# Patient Record
Sex: Female | Born: 1935 | Race: Black or African American | Hispanic: No | Marital: Single | Smoking: Never smoker
Health system: Southern US, Community
[De-identification: ages and names within clinical notes are randomized; demographics above are authoritative.]

## PROBLEM LIST (undated history)

## (undated) DIAGNOSIS — G20A1 Parkinson's disease without dyskinesia, without mention of fluctuations: Secondary | ICD-10-CM

## (undated) DIAGNOSIS — I10 Essential (primary) hypertension: Secondary | ICD-10-CM

## (undated) DIAGNOSIS — E119 Type 2 diabetes mellitus without complications: Secondary | ICD-10-CM

## (undated) DIAGNOSIS — J45909 Unspecified asthma, uncomplicated: Secondary | ICD-10-CM

## (undated) DIAGNOSIS — G20 Parkinson's disease: Secondary | ICD-10-CM

## (undated) DIAGNOSIS — E785 Hyperlipidemia, unspecified: Secondary | ICD-10-CM

## (undated) DIAGNOSIS — K219 Gastro-esophageal reflux disease without esophagitis: Secondary | ICD-10-CM

## (undated) HISTORY — PX: BACK SURGERY: SHX140

## (undated) HISTORY — DX: Gastro-esophageal reflux disease without esophagitis: K21.9

---

## 2005-02-13 ENCOUNTER — Ambulatory Visit: Payer: Self-pay

## 2005-05-10 ENCOUNTER — Other Ambulatory Visit: Payer: Self-pay

## 2005-05-10 ENCOUNTER — Emergency Department: Payer: Self-pay | Admitting: General Practice

## 2007-07-20 ENCOUNTER — Emergency Department: Payer: Self-pay | Admitting: Emergency Medicine

## 2009-03-28 ENCOUNTER — Ambulatory Visit: Payer: Self-pay | Admitting: Unknown Physician Specialty

## 2009-05-12 ENCOUNTER — Ambulatory Visit: Payer: Self-pay | Admitting: Pain Medicine

## 2009-05-25 ENCOUNTER — Ambulatory Visit: Payer: Self-pay | Admitting: Pain Medicine

## 2009-06-16 ENCOUNTER — Ambulatory Visit: Payer: Self-pay | Admitting: Pain Medicine

## 2009-07-20 ENCOUNTER — Ambulatory Visit: Payer: Self-pay | Admitting: Pain Medicine

## 2009-08-23 ENCOUNTER — Ambulatory Visit: Payer: Self-pay | Admitting: Pain Medicine

## 2009-09-22 ENCOUNTER — Ambulatory Visit: Payer: Self-pay | Admitting: Pain Medicine

## 2009-09-23 ENCOUNTER — Ambulatory Visit: Payer: Self-pay | Admitting: Internal Medicine

## 2009-10-05 ENCOUNTER — Ambulatory Visit: Payer: Self-pay | Admitting: Pain Medicine

## 2009-11-08 ENCOUNTER — Ambulatory Visit: Payer: Self-pay | Admitting: Pain Medicine

## 2009-12-07 ENCOUNTER — Ambulatory Visit: Payer: Self-pay | Admitting: Pain Medicine

## 2010-01-12 ENCOUNTER — Ambulatory Visit: Payer: Self-pay | Admitting: Pain Medicine

## 2010-01-25 ENCOUNTER — Ambulatory Visit: Payer: Self-pay | Admitting: Pain Medicine

## 2010-02-17 ENCOUNTER — Inpatient Hospital Stay: Payer: Self-pay | Admitting: Specialist

## 2010-04-13 ENCOUNTER — Ambulatory Visit: Payer: Self-pay | Admitting: Pain Medicine

## 2010-06-27 ENCOUNTER — Ambulatory Visit: Payer: Self-pay | Admitting: Ophthalmology

## 2011-01-24 ENCOUNTER — Ambulatory Visit: Payer: Self-pay | Admitting: Pain Medicine

## 2011-08-09 ENCOUNTER — Emergency Department: Payer: Self-pay | Admitting: Emergency Medicine

## 2011-10-18 ENCOUNTER — Emergency Department: Payer: Self-pay | Admitting: *Deleted

## 2012-08-01 ENCOUNTER — Ambulatory Visit: Payer: Self-pay | Admitting: Gastroenterology

## 2012-08-08 ENCOUNTER — Ambulatory Visit: Payer: Self-pay | Admitting: Gastroenterology

## 2012-08-12 LAB — PATHOLOGY REPORT

## 2013-04-29 ENCOUNTER — Ambulatory Visit: Payer: Self-pay | Admitting: Neurology

## 2013-05-26 ENCOUNTER — Ambulatory Visit: Payer: Self-pay | Admitting: Ophthalmology

## 2013-10-28 ENCOUNTER — Ambulatory Visit: Payer: Self-pay | Admitting: Family Medicine

## 2015-08-19 ENCOUNTER — Other Ambulatory Visit: Payer: Self-pay | Admitting: Family

## 2015-08-19 DIAGNOSIS — G20 Parkinson's disease: Secondary | ICD-10-CM

## 2015-08-19 DIAGNOSIS — F0390 Unspecified dementia without behavioral disturbance: Secondary | ICD-10-CM

## 2015-09-16 ENCOUNTER — Ambulatory Visit
Admission: RE | Admit: 2015-09-16 | Discharge: 2015-09-16 | Disposition: A | Discharge status: Home / Self Care | Payer: Medicare (Managed Care) | Source: Ambulatory Visit | Attending: Family | Admitting: Family

## 2015-09-16 DIAGNOSIS — G20 Parkinson's disease: Secondary | ICD-10-CM | POA: Insufficient documentation

## 2015-09-16 DIAGNOSIS — F0390 Unspecified dementia without behavioral disturbance: Secondary | ICD-10-CM

## 2015-09-16 DIAGNOSIS — R413 Other amnesia: Secondary | ICD-10-CM | POA: Diagnosis not present

## 2015-09-16 HISTORY — DX: Unspecified asthma, uncomplicated: J45.909

## 2015-09-16 HISTORY — DX: Type 2 diabetes mellitus without complications: E11.9

## 2015-09-16 LAB — POCT I-STAT CREATININE: Creatinine, Ser: 0.7 mg/dL (ref 0.44–1.00)

## 2015-09-16 MED ORDER — IOHEXOL 300 MG/ML  SOLN: 75.0000 mL | Freq: Once | INTRAMUSCULAR | Status: DC | PRN

## 2016-02-06 ENCOUNTER — Emergency Department
Admission: EM | Admit: 2016-02-06 | Discharge: 2016-02-06 | Disposition: A | Discharge status: Home / Self Care | Payer: Medicare (Managed Care) | Attending: Emergency Medicine | Admitting: Emergency Medicine

## 2016-02-06 ENCOUNTER — Emergency Department: Payer: Medicare (Managed Care)

## 2016-02-06 DIAGNOSIS — E119 Type 2 diabetes mellitus without complications: Secondary | ICD-10-CM | POA: Diagnosis not present

## 2016-02-06 DIAGNOSIS — J45909 Unspecified asthma, uncomplicated: Secondary | ICD-10-CM | POA: Diagnosis not present

## 2016-02-06 DIAGNOSIS — G20 Parkinson's disease: Secondary | ICD-10-CM | POA: Diagnosis not present

## 2016-02-06 DIAGNOSIS — J111 Influenza due to unidentified influenza virus with other respiratory manifestations: Secondary | ICD-10-CM | POA: Insufficient documentation

## 2016-02-06 DIAGNOSIS — Z7984 Long term (current) use of oral hypoglycemic drugs: Secondary | ICD-10-CM | POA: Insufficient documentation

## 2016-02-06 HISTORY — DX: Parkinson's disease: G20

## 2016-02-06 HISTORY — DX: Parkinson's disease without dyskinesia, without mention of fluctuations: G20.A1

## 2016-02-06 LAB — COMPREHENSIVE METABOLIC PANEL
ALT: <5 U/L — ABNORMAL LOW (ref 14–54)
ANION GAP: 4 — AB (ref 5–15)
AST: 20 U/L (ref 15–41)
Albumin: 3 g/dL — ABNORMAL LOW (ref 3.5–5.0)
Alkaline Phosphatase: 36 U/L — ABNORMAL LOW (ref 38–126)
BUN: 16 mg/dL (ref 6–20)
CHLORIDE: 109 mmol/L (ref 101–111)
CO2: 23 mmol/L (ref 22–32)
Calcium: 6.6 mg/dL — ABNORMAL LOW (ref 8.9–10.3)
Creatinine, Ser: 0.61 mg/dL (ref 0.44–1.00)
Glucose, Bld: 91 mg/dL (ref 65–99)
POTASSIUM: 3.4 mmol/L — AB (ref 3.5–5.1)
Sodium: 136 mmol/L (ref 135–145)
Total Bilirubin: 0.7 mg/dL (ref 0.3–1.2)
Total Protein: 5.2 g/dL — ABNORMAL LOW (ref 6.5–8.1)

## 2016-02-06 LAB — CBC
HCT: 33.7 % — ABNORMAL LOW (ref 35.0–47.0)
Hemoglobin: 10.8 g/dL — ABNORMAL LOW (ref 12.0–16.0)
MCH: 27.7 pg (ref 26.0–34.0)
MCHC: 32.1 g/dL (ref 32.0–36.0)
MCV: 86.3 fL (ref 80.0–100.0)
PLATELETS: 128 10*3/uL — AB (ref 150–440)
RBC: 3.91 MIL/uL (ref 3.80–5.20)
RDW: 14.4 % (ref 11.5–14.5)
WBC: 5.8 10*3/uL (ref 3.6–11.0)

## 2016-02-06 LAB — TROPONIN I

## 2016-02-06 NOTE — Discharge Instructions (Signed)

## 2016-02-06 NOTE — ED Notes (Signed)
Pt transported to xray 

## 2016-02-06 NOTE — ED Provider Notes (Signed)
[facility] Emergency Department Provider Note  Time seen: 5:38 PM  I have reviewed the triage vital signs and the nursing notes.   HISTORY  Chief Complaint Influenza    HPI Hannah Jackson is a 82 y.o. female with a past medical history of asthma, Parkinson's, diabetes who presents the emergency department with hypoxia. According to the patient, her sister and the patient's pace physician, the patient was seen earlier today at home for cough and fever, with complaints of just not feeling well. Per the pace physician and the patient was diagnosed with influenza via a flu swab. She also had a mild wheeze this afternoon so they gave the patient albuterol treatment. She states during that evaluation the patient desats to into the 80s, so she recommended bringing the patient to the emergency department for evaluation. The pace physician states since that initial desaturation the patient has had a normal oxygen saturation on room air. In the emergency department patient's oxygen levels have been 96-97 percent on room air. Patient does state cough, currently afebrile. Denies chest pain or trouble breathing.     Past Medical History  Diagnosis Date  . Diabetes mellitus without complication (HCC)     on metformin  . Asthma   . Parkinson's disease (HCC)     There are no active problems to display for this patient.   Past Surgical History  Procedure Laterality Date  . Back surgery      No current outpatient prescriptions on file.  Allergies Review of patient's allergies indicates no known allergies.  No family history on file.  Social History Social History  Substance Use Topics  . Smoking status: Never Smoker   . Smokeless tobacco: None  . Alcohol Use: No    Review of Systems Constitutional: Subjective fevers at home. Positive nasal congestion. Cardiovascular: Negative for chest pain. Respiratory: Negative for shortness of breath. Positive for  cough. Gastrointestinal: Negative for abdominal pain, vomiting and diarrhea. Musculoskeletal: Negative for back pain. Neurological: Negative for headache 10-point ROS otherwise negative.  ____________________________________________   PHYSICAL EXAM:  VITAL SIGNS: ED Triage Vitals  Enc Vitals Group     BP 02/06/16 1600 132/50 mmHg     Pulse Rate 02/06/16 1600 80     Resp 02/06/16 1612 20     Temp 02/06/16 1612 98.2 F (36.8 C)     Temp Source 02/06/16 1612 Oral     SpO2 02/06/16 1600 93 %     Weight 02/06/16 1612 184 lb 4.9 oz (83.6 kg)     Height 02/06/16 1612 5\' 4"  (1.626 m)     Head Cir --      Peak Flow --      Pain Score --      Pain Loc --      Pain Edu? --      Excl. in GC? --     Constitutional: Alert. Well appearing and in no distress. Eyes: Normal exam ENT   Head: Normocephalic and atraumatic.   Mouth/Throat: Mucous membranes are moist. Cardiovascular: Normal rate, regular rhythm. No murmur Respiratory: Normal respiratory effort without tachypnea nor retractions. Mild expiratory wheeze on the left. Largely clears with coughing. Gastrointestinal: Soft and nontender. No distention Musculoskeletal: Nontender with normal range of motion in all extremities. Neurologic:  Normal speech and language. No gross focal neurologic deficits  Skin:  Skin is warm, dry and intact.  Psychiatric: Mood and affect are normal. Speech and behavior are normal.   ____________________________________________  EKG  EKG reviewed and interpreted, so shows normal sinus rhythm 82 bpm, narrow QRS, normal axis, normal intervals, nonspecific but no concerning ST changes.  ____________________________________________    RADIOLOGY  Peribronchial cuffing on chest x-ray  ____________________________________________   INITIAL IMPRESSION / ASSESSMENT AND PLAN / ED COURSE  Pertinent labs & imaging results that were available during my care of the patient were reviewed by me and  considered in my medical decision making (see chart for details).  Patient presents the emergency department with hypoxia with a recent influenza diagnosis. Patient is here with her pace physician, who is already ordered the patient Tamiflu. The sister is also here who states she will be staying with the patient until she is better. Currently the patient is in no distress, 97% on room air in the emergency department. We will check labs, chest x-ray. If within normal limits and this is likely discharged home as the patient has very good follow-up, and a caring family member who will be staying with her.  Labs are largely within normal limits. Normal white blood cell count. Negative troponin. Chest x-ray shows peribronchial cuffing, which likely explains her intermittent wheeze. The pace physician has a below the to provide nebulizer treatments for the patient at home as well as Tamiflu for the patient at home. Sister will be staying with the patient, and the pace physician will be keeping a close eye on her. With all of this follow-up I believe the patient is safe for discharge home with close monitoring by her primary physician.  ____________________________________________   FINAL CLINICAL IMPRESSION(S) / ED DIAGNOSES  Influenza   Minna Antis, MD 02/06/16 1800

## 2016-02-06 NOTE — ED Notes (Signed)
Lucretia RoersKetah Brooks, NP [phone]). Dr. Lajuana MatteSharon Riley [phone])

## 2016-02-06 NOTE — ED Notes (Signed)
Pt came to the ED via EMS from [facility]. Pt was diagnosed with the flu. Per EMS, pt was satting in the high 80s earlier. With EMS pt was satting 96-98% on r/a. Pt satting 97% on r/a upon arrival. Pt has non productive cough. Pt reports coughing for a few weeks. Pt alert and oriented. History of Parkinsons disease.

## 2016-02-06 NOTE — ED Notes (Signed)
Pts nurse practitioner reports patient had a fever earlier and was changing in mentation. Reports pt was having a harder time than usual transfering. Reports patting was satting in the 80s. Reports pt seems to be better compared to earlier. Pt alert and oriented. Sts her name, birthday, month, and where she is.

## 2017-09-24 ENCOUNTER — Other Ambulatory Visit: Payer: Self-pay

## 2017-09-24 DIAGNOSIS — J45909 Unspecified asthma, uncomplicated: Secondary | ICD-10-CM | POA: Insufficient documentation

## 2017-09-24 DIAGNOSIS — G20 Parkinson's disease: Secondary | ICD-10-CM | POA: Insufficient documentation

## 2017-09-24 DIAGNOSIS — R040 Epistaxis: Secondary | ICD-10-CM | POA: Diagnosis not present

## 2017-09-24 DIAGNOSIS — E119 Type 2 diabetes mellitus without complications: Secondary | ICD-10-CM | POA: Insufficient documentation

## 2017-09-24 NOTE — ED Triage Notes (Signed)
Patient to ED with onset of epistaxis at about 1830 tonight. Has been unable to get it to stop. Patient is on 81 mg aspirin daily and denies other antiplatelets.

## 2017-09-25 ENCOUNTER — Emergency Department
Admission: EM | Admit: 2017-09-25 | Discharge: 2017-09-25 | Disposition: A | Discharge status: Home / Self Care | Payer: Medicare (Managed Care) | Attending: Emergency Medicine | Admitting: Emergency Medicine

## 2017-09-25 DIAGNOSIS — R040 Epistaxis: Secondary | ICD-10-CM

## 2017-09-25 NOTE — ED Provider Notes (Signed)
[facility] Emergency Department Provider Note   First MD Initiated Contact with Patient 09/25/17 0144     (approximate)  I have reviewed the triage vital signs and the nursing notes.   HISTORY  Chief Complaint Epistaxis    HPI Hannah Jackson is a 83 y.o. female with blow list of chronic medical conditions presents to the emergency department with epistaxis from the left nares which began at 6:30 PM last night. Patient's family states that they were unable to stop the nosebleed which prompted him to come to the emergency department.  The patient's family states that she does take an 81 mg aspirin per day however no other anticoagulation medications.   Past Medical History:  Diagnosis Date  . Asthma   . Diabetes mellitus without complication (HCC)    on metformin  . Parkinson's disease (HCC)     There are no active problems to display for this patient.   Past Surgical History:  Procedure Laterality Date  . BACK SURGERY      Prior to Admission medications   Not on File    Allergies No known drug allergies No family history on file.  Social History Social History   Tobacco Use  . Smoking status: Never Smoker  Substance Use Topics  . Alcohol use: No  . Drug use: No    Review of Systems Constitutional: No fever/chills Eyes: No visual changes. ENT: No sore throat. Positive for left nosebleed Cardiovascular: Denies chest pain. Respiratory: Denies shortness of breath. Gastrointestinal: No abdominal pain.  No nausea, no vomiting.  No diarrhea.  No constipation. Genitourinary: Negative for dysuria. Musculoskeletal: Negative for neck pain.  Negative for back pain. Integumentary: Negative for rash. Neurological: Negative for headaches, focal weakness or numbness.   ____________________________________________   PHYSICAL EXAM:  VITAL SIGNS: ED Triage Vitals  Enc Vitals Group     BP 09/24/17 2130 (!) 151/91     Pulse Rate 09/24/17  2130 90     Resp 09/24/17 2130 18     Temp 09/24/17 2130 98.4 F (36.9 C)     Temp Source 09/24/17 2130 Oral     SpO2 09/24/17 2130 100 %     Weight 09/24/17 2132 92.1 kg (203 lb)     Height 09/24/17 2132 1.626 m (5\' 4" )     Head Circumference --      Peak Flow --      Pain Score 09/24/17 2130 0     Pain Loc --      Pain Edu? --      Excl. in GC? --     Constitutional: Alert and oriented. Well appearing and in no acute distress. Eyes: Conjunctivae are normal. Head: Atraumatic. Nose: No active epistaxis at this time however evidence of recent epistaxis from the left nares noted with a abrasion with clot in place Mouth/Throat: Mucous membranes are moist.  Oropharynx non-erythematous. Neck: No stridor.  Neurologic:  Normal speech and language. No gross focal neurologic deficits are appreciated.  Skin:  Skin is warm, dry and intact. No rash noted. Psychiatric: Mood and affect are normal. Speech and behavior are normal.  ____________________________________________     Procedures   ____________________________________________   INITIAL IMPRESSION / ASSESSMENT AND PLAN / ED COURSE  As part of my medical decision making, I reviewed the following data within the electronic MEDICAL RECORD [id number] year old female presenting with history of epistaxis at home active bleeding at this time. Spoke with the patient and family at  length regarding epistaxis management at home if it were to recur. Patient given nasal clamp for home ____________________________________________  FINAL CLINICAL IMPRESSION(S) / ED DIAGNOSES  Final diagnoses:  Epistaxis     MEDICATIONS GIVEN DURING THIS VISIT:  Medications - No data to display   ED Discharge Orders    None       Note:  This document was prepared using Dragon voice recognition software and may include unintentional dictation errors.    Darci CurrentBrown, [employer], MD 09/25/17 [phone]

## 2018-02-19 ENCOUNTER — Encounter: Payer: Self-pay | Admitting: Emergency Medicine

## 2018-02-19 ENCOUNTER — Emergency Department: Payer: Medicare (Managed Care)

## 2018-02-19 ENCOUNTER — Inpatient Hospital Stay
Admission: EM | Admit: 2018-02-19 | Discharge: 2018-02-24 | DRG: 202 | Disposition: A | Discharge status: Skilled Nursing Facility | Payer: Medicare (Managed Care) | Attending: Internal Medicine | Admitting: Internal Medicine

## 2018-02-19 DIAGNOSIS — D649 Anemia, unspecified: Secondary | ICD-10-CM | POA: Diagnosis present

## 2018-02-19 DIAGNOSIS — J45901 Unspecified asthma with (acute) exacerbation: Secondary | ICD-10-CM | POA: Diagnosis present

## 2018-02-19 DIAGNOSIS — E11649 Type 2 diabetes mellitus with hypoglycemia without coma: Secondary | ICD-10-CM | POA: Diagnosis not present

## 2018-02-19 DIAGNOSIS — D696 Thrombocytopenia, unspecified: Secondary | ICD-10-CM | POA: Diagnosis present

## 2018-02-19 DIAGNOSIS — J449 Chronic obstructive pulmonary disease, unspecified: Secondary | ICD-10-CM | POA: Diagnosis present

## 2018-02-19 DIAGNOSIS — I10 Essential (primary) hypertension: Secondary | ICD-10-CM | POA: Diagnosis present

## 2018-02-19 DIAGNOSIS — J9601 Acute respiratory failure with hypoxia: Secondary | ICD-10-CM | POA: Diagnosis present

## 2018-02-19 DIAGNOSIS — Z8249 Family history of ischemic heart disease and other diseases of the circulatory system: Secondary | ICD-10-CM | POA: Diagnosis not present

## 2018-02-19 DIAGNOSIS — R4702 Dysphasia: Secondary | ICD-10-CM | POA: Diagnosis present

## 2018-02-19 DIAGNOSIS — K228 Other specified diseases of esophagus: Secondary | ICD-10-CM | POA: Diagnosis present

## 2018-02-19 DIAGNOSIS — G20 Parkinson's disease: Secondary | ICD-10-CM | POA: Diagnosis present

## 2018-02-19 DIAGNOSIS — Z6834 Body mass index (BMI) 34.0-34.9, adult: Secondary | ICD-10-CM

## 2018-02-19 DIAGNOSIS — E785 Hyperlipidemia, unspecified: Secondary | ICD-10-CM | POA: Diagnosis present

## 2018-02-19 DIAGNOSIS — Z806 Family history of leukemia: Secondary | ICD-10-CM | POA: Diagnosis not present

## 2018-02-19 DIAGNOSIS — R0602 Shortness of breath: Secondary | ICD-10-CM | POA: Diagnosis present

## 2018-02-19 DIAGNOSIS — R059 Cough, unspecified: Secondary | ICD-10-CM

## 2018-02-19 DIAGNOSIS — F0280 Dementia in other diseases classified elsewhere without behavioral disturbance: Secondary | ICD-10-CM | POA: Diagnosis present

## 2018-02-19 DIAGNOSIS — E119 Type 2 diabetes mellitus without complications: Secondary | ICD-10-CM

## 2018-02-19 DIAGNOSIS — E669 Obesity, unspecified: Secondary | ICD-10-CM | POA: Diagnosis present

## 2018-02-19 DIAGNOSIS — N390 Urinary tract infection, site not specified: Secondary | ICD-10-CM | POA: Diagnosis present

## 2018-02-19 DIAGNOSIS — R0603 Acute respiratory distress: Secondary | ICD-10-CM

## 2018-02-19 DIAGNOSIS — N3001 Acute cystitis with hematuria: Secondary | ICD-10-CM | POA: Diagnosis present

## 2018-02-19 DIAGNOSIS — R0902 Hypoxemia: Secondary | ICD-10-CM

## 2018-02-19 DIAGNOSIS — R1310 Dysphagia, unspecified: Secondary | ICD-10-CM

## 2018-02-19 DIAGNOSIS — R05 Cough: Secondary | ICD-10-CM

## 2018-02-19 DIAGNOSIS — G20A1 Parkinson's disease without dyskinesia, without mention of fluctuations: Secondary | ICD-10-CM | POA: Diagnosis present

## 2018-02-19 HISTORY — DX: Hyperlipidemia, unspecified: E78.5

## 2018-02-19 HISTORY — DX: Essential (primary) hypertension: I10

## 2018-02-19 LAB — COMPREHENSIVE METABOLIC PANEL
ALK PHOS: 59 U/L (ref 38–126)
ALT: <5 U/L — ABNORMAL LOW (ref 14–54)
AST: 19 U/L (ref 15–41)
Albumin: 4.4 g/dL (ref 3.5–5.0)
Anion gap: 8 (ref 5–15)
BILIRUBIN TOTAL: 0.5 mg/dL (ref 0.3–1.2)
BUN: 25 mg/dL — ABNORMAL HIGH (ref 6–20)
CALCIUM: 8.9 mg/dL (ref 8.9–10.3)
CO2: 27 mmol/L (ref 22–32)
Chloride: 107 mmol/L (ref 101–111)
Creatinine, Ser: 0.91 mg/dL (ref 0.44–1.00)
GFR, EST NON AFRICAN AMERICAN: 55 mL/min — AB (ref >60)
GLUCOSE: 191 mg/dL — AB (ref 65–99)
POTASSIUM: 3.6 mmol/L (ref 3.5–5.1)
Sodium: 142 mmol/L (ref 135–145)
TOTAL PROTEIN: 7.5 g/dL (ref 6.5–8.1)

## 2018-02-19 LAB — BRAIN NATRIURETIC PEPTIDE: B NATRIURETIC PEPTIDE 5: 165 pg/mL — AB (ref 0.0–100.0)

## 2018-02-19 LAB — URINALYSIS, COMPLETE (UACMP) WITH MICROSCOPIC
BILIRUBIN URINE: NEGATIVE
Glucose, UA: NEGATIVE mg/dL
KETONES UR: 5 mg/dL — AB
Nitrite: NEGATIVE
PH: 5 (ref 5.0–8.0)
Protein, ur: 30 mg/dL — AB
SPECIFIC GRAVITY, URINE: 1.029 (ref 1.005–1.030)

## 2018-02-19 LAB — BLOOD GAS, VENOUS
ACID-BASE EXCESS: 1.8 mmol/L (ref 0.0–2.0)
BICARBONATE: 28.6 mmol/L — AB (ref 20.0–28.0)
Delivery systems: POSITIVE
O2 SAT: 79.7 %
PATIENT TEMPERATURE: 37
PO2 VEN: 47 mmHg — AB (ref 32.0–45.0)
pCO2, Ven: 53 mmHg (ref 44.0–60.0)
pH, Ven: 7.34 (ref 7.250–7.430)

## 2018-02-19 LAB — LACTIC ACID, PLASMA: Lactic Acid, Venous: 1.8 mmol/L (ref 0.5–1.9)

## 2018-02-19 LAB — INFLUENZA PANEL BY PCR (TYPE A & B)
INFLAPCR: NEGATIVE
INFLBPCR: NEGATIVE

## 2018-02-19 MED ORDER — LEVOFLOXACIN IN D5W 750 MG/150ML IV SOLN: 750.0000 mg | INTRAVENOUS | Status: DC

## 2018-02-19 MED ORDER — IPRATROPIUM-ALBUTEROL 0.5-2.5 (3) MG/3ML IN SOLN
3.0000 mL | Freq: Once | RESPIRATORY_TRACT | Status: AC
  Administered 2018-02-19: 3 mL via RESPIRATORY_TRACT
  Filled 2018-02-19: qty 3

## 2018-02-19 MED ORDER — LEVOFLOXACIN IN D5W 750 MG/150ML IV SOLN
750.0000 mg | Freq: Once | INTRAVENOUS | Status: AC
Start: 2018-02-19 — End: 2018-02-20
  Administered 2018-02-19: 750 mg via INTRAVENOUS
  Filled 2018-02-19: qty 150

## 2018-02-19 NOTE — ED Provider Notes (Addendum)
[facility] Emergency Department Provider Note  ____________________________________________  Time seen: Approximately 10:19 PM  I have reviewed the triage vital signs and the nursing notes.   HISTORY  Chief Complaint Respiratory Distress    HPI Hannah Jackson is a 84 y.o. female with a history of asthma, Parkinson's disease, DM presenting for shortness of breath.  The patient reports that she has been having a cough without any congestion or rhinorrhea, sore throat, or ear pain, no fevers or chills, for the past 2 days.  Tonight, she called EMS for progressive wheezing and shortness of breath and was stiff found to have oxygen saturations of 92% on 4 L nasal cannula.  She was given an albuterol and 2 DuoNeb treatments, 125 mg of Solu-Medrol, and 2 mg of magnesium sulfate.  The patient denies any chest pain, palpitations, lightheadedness or fainting.  Past Medical History:  Diagnosis Date  . Asthma   . COPD (chronic obstructive pulmonary disease) (HCC)   . Diabetes mellitus without complication (HCC)    on metformin  . HLD (hyperlipidemia)   . HTN (hypertension)   . Parkinson's disease (HCC)     There are no active problems to display for this patient.   Past Surgical History:  Procedure Laterality Date  . BACK SURGERY        Allergies Patient has no known allergies.  Family History  Problem Relation Age of Onset  . Leukemia Mother   . Stroke Mother   . CAD Father   . Hypertension Father   . Heart attack Father     Social History Social History   Tobacco Use  . Smoking status: Never Smoker  . Smokeless tobacco: Never Used  Substance Use Topics  . Alcohol use: No  . Drug use: No    Review of Systems Constitutional: No fever/chills.  As of general malaise. Eyes: No visual changes.  No eye discharge. ENT: No sore throat. No congestion or rhinorrhea. Cardiovascular: Denies chest pain. Denies palpitations. Respiratory: Positive  wheezing and shortness of breath.  Positive cough. Gastrointestinal: No abdominal pain.  No nausea, no vomiting.  No diarrhea.  No constipation. Genitourinary: Negative for dysuria. Musculoskeletal: Negative for back pain.  No lower extremity swelling or calf pain. Skin: Negative for rash. Neurological: Negative for headaches. No focal numbness, tingling or weakness.     ____________________________________________   PHYSICAL EXAM:  VITAL SIGNS: ED Triage Vitals  Enc Vitals Group     BP      Pulse      Resp      Temp      Temp src      SpO2      Weight      Height      Head Circumference      Peak Flow      Pain Score      Pain Loc      Pain Edu?      Excl. in GC?     Constitutional: Alert and oriented but somewhat slow to answer questions.  Answers questions appropriately.  She is chronically ill-appearing, and looks like she does not feel well.  She is breathing rapidly but maintaining oxygen saturations of 100% on 4 L nasal cannula. Eyes: Conjunctivae are normal.  EOMI. No scleral icterus. Head: Atraumatic. Nose: No congestion/rhinnorhea. Mouth/Throat: Mucous membranes are moist.  Neck: No stridor.  Supple.  No JVD.  No meningismus. Cardiovascular: Fast rate, regular rhythm. No murmurs, rubs or gallops.  Respiratory: The patient is tachypneic with a prolonged expiratory phase.  She does have expiratory greater than inspiratory wheezing with fair air exchange.  She is able to speak in 3 word sentences.  No rales or rhonchi. Gastrointestinal: Obese.  Soft, nontender and nondistended.  No guarding or rebound.  No peritoneal signs. Musculoskeletal: Minimal nonpitting symmetric LE edema around the ankles. No ttp in the calves or palpable cords.  Negative Homan's sign. Neurologic:  A&Ox3.  Speech is clear.  Face and smile are symmetric.  EOMI.  Moves all extremities well. Skin:  Skin is warm, dry and intact. No rash noted. Psychiatric: Mood and affect are normal. Speech and  behavior are normal.  Normal judgement.  ____________________________________________   LABS (all labs ordered are listed, but only abnormal results are displayed)  Labs Reviewed  URINALYSIS, COMPLETE (UACMP) WITH MICROSCOPIC - Abnormal; Notable for the following components:      Result Value   Color, Urine YELLOW (*)    APPearance CLOUDY (*)    Hgb urine dipstick LARGE (*)    Ketones, ur 5 (*)    Protein, ur 30 (*)    Leukocytes, UA TRACE (*)    Bacteria, UA RARE (*)    Squamous Epithelial / LPF 6-30 (*)    All other components within normal limits  BLOOD GAS, VENOUS - Abnormal; Notable for the following components:   pO2, Ven 47.0 (*)    Bicarbonate 28.6 (*)    All other components within normal limits  CULTURE, BLOOD (ROUTINE X 2)  CULTURE, BLOOD (ROUTINE X 2)  LACTIC ACID, PLASMA  LACTIC ACID, PLASMA  COMPREHENSIVE METABOLIC PANEL  INFLUENZA PANEL BY PCR (TYPE A & B)  BRAIN NATRIURETIC PEPTIDE   ____________________________________________  EKG  ED ECG REPORT I, Rockne Menghini, the attending physician, personally viewed and interpreted this ECG.   Date: 02/19/2018  EKG Time: 2218  Rate: 108  Rhythm: sinus tachycardia  Axis: leftward  Intervals:none  ST&T Change: No STEMI  ____________________________________________  RADIOLOGY  No results found.  ____________________________________________   PROCEDURES  Procedure(s) performed: None  Procedures  Critical Care performed: Yes, see critical care note(s) ____________________________________________   INITIAL IMPRESSION / ASSESSMENT AND PLAN / ED COURSE  Pertinent labs & imaging results that were available during my care of the patient were reviewed by me and considered in my medical decision making (see chart for details).  84 y.o. female with a history of asthma presenting with 2 days of cough without any other URI symptoms, wheezing and shortness of breath with hypoxia upon EMS arrival.   Overall, I am concerned about asthma or COPD exacerbation and would also consider edema or CHF.  An acute infection is also possible we rechecked her temperature here which was afebrile, but her skin is warm to touch and she has been having a cough.  I will cover her with antibiotics, until we have a better picture of what may be causing her symptoms.  The patient does not have any ischemic changes on her EKG and we will check a troponin.  The patient will require admission for continued evaluation and treatment.   ----------------------------------------- 11:05 PM on 02/19/2018 -----------------------------------------  The patient's blood gas is reassuring.  She has been signed out to the oncoming physician for final disposition and admission.  CRITICAL CARE Performed by: Rockne Menghini   Total critical care time: 40 minutes  Critical care time was exclusive of separately billable procedures and treating other patients.  Critical care was  necessary to treat or prevent imminent or life-threatening deterioration.  Critical care was time spent personally by me on the following activities: development of treatment plan with patient and/or surrogate as well as nursing, discussions with consultants, evaluation of patient's response to treatment, examination of patient, obtaining history from patient or surrogate, ordering and performing treatments and interventions, ordering and review of laboratory studies, ordering and review of radiographic studies, pulse oximetry and re-evaluation of patient's condition.  ____________________________________________  FINAL CLINICAL IMPRESSION(S) / ED DIAGNOSES  Final diagnoses:  Shortness of breath  Respiratory distress  Hypoxia  Cough         NEW MEDICATIONS STARTED DURING THIS VISIT:  New Prescriptions   No medications on file      Rockne MenghiniNorman, Anne-Caroline, MD 02/19/18 2306    Rockne MenghiniNorman, Anne-Caroline, MD 03/11/18 2258

## 2018-02-19 NOTE — ED Triage Notes (Signed)
Pt comes into the ED via [facility] from home c/o respiratory distress.  Patient started having a coughing spell, became more lethargic, and started desating.  Patient has h/o asthma and COPD.  Patient would not accept breathing treatment at home with home health nurse.  When EMS arrived patient was at 92% on nasal cannula of 4 L.  Patient then placed on non-re breather with 1 albuterol and 2 duonebs.  Patient now saturating at 100%.  Patient also given 2g mag and 125 solumedrol.  Patient answering questions at this time. Patient recently started on prednisone and had to receive back to back home nebs treatments on Monday due to excess wheezing.  Patient presents with cough at this time and expiratory wheezing.

## 2018-02-19 NOTE — H&P (Signed)
[facility] [place] [facility] [place] [facility]   PATIENT NAME: Hannah Jackson    MR#:  [id number]  DATE OF BIRTH:  [birth year]  DATE OF ADMISSION:  02/19/2018  PRIMARY CARE PHYSICIAN: System, Pcp Not In   REQUESTING/REFERRING PHYSICIAN: Sharma Covert, MD  CHIEF COMPLAINT:   Chief Complaint  Patient presents with  . Respiratory Distress    HISTORY OF PRESENT ILLNESS:  Hannah Jackson  is a 84 y.o. female who presents with several days of progressive respiratory symptoms.  Patient is unable to provide much history herself due to dementia from Parkinson's disease.  However, she has been with her daughter for the past 3-4 days and her daughter provides a history that she has had progressive wheezing, shortness of breath.  She was started on some prednisone, but her symptoms have continued to progress.  Here in the ED she was placed on BiPAP with stabilization of her O2 sats.  She is treated for asthma exacerbation and hospitalist were called for admission.  PAST MEDICAL HISTORY:   Past Medical History:  Diagnosis Date  . Asthma   . COPD (chronic obstructive pulmonary disease) (HCC)   . Diabetes mellitus without complication (HCC)    on metformin  . HLD (hyperlipidemia)   . HTN (hypertension)   . Parkinson's disease (HCC)      PAST SURGICAL HISTORY:   Past Surgical History:  Procedure Laterality Date  . BACK SURGERY       SOCIAL HISTORY:   Social History   Tobacco Use  . Smoking status: Never Smoker  . Smokeless tobacco: Never Used  Substance Use Topics  . Alcohol use: No     FAMILY HISTORY:   Family History  Problem Relation Age of Onset  . Leukemia Mother   . Stroke Mother   . CAD Father   . Hypertension Father   . Heart attack Father      DRUG ALLERGIES:  No Known Allergies  MEDICATIONS AT HOME:   Prior to Admission medications   Not on File    REVIEW OF SYSTEMS:  Review of Systems  Constitutional: Negative for chills, fever, malaise/fatigue  and weight loss.  HENT: Negative for ear pain, hearing loss and tinnitus.   Eyes: Negative for blurred vision, double vision, pain and redness.  Respiratory: Positive for cough, shortness of breath and wheezing. Negative for hemoptysis.   Cardiovascular: Negative for chest pain, palpitations, orthopnea and leg swelling.  Gastrointestinal: Negative for abdominal pain, constipation, diarrhea, nausea and vomiting.  Genitourinary: Positive for dysuria. Negative for frequency and hematuria.  Musculoskeletal: Negative for back pain, joint pain and neck pain.  Skin:       No acne, rash, or lesions  Neurological: Negative for dizziness, tremors, focal weakness and weakness.  Endo/Heme/Allergies: Negative for polydipsia. Does not bruise/bleed easily.  Psychiatric/Behavioral: Negative for depression. The patient is not nervous/anxious and does not have insomnia.      VITAL SIGNS:   Vitals:   02/19/18 2222 02/19/18 2223 02/19/18 2239 02/19/18 2330  BP: (!) 168/65   (!) 156/57  Pulse: (!) 108   (!) 101  Resp: (!) 22   16  Temp: 98.5 F (36.9 C)  98.5 F (36.9 C)   TempSrc: Oral  Rectal   SpO2: 100%   100%  Weight:  90.7 kg (200 lb)    Height:  5\' 4"  (1.626 m)     Wt Readings from Last 3 Encounters:  02/19/18 90.7 kg (200 lb)  09/24/17 92.1 kg (  203 lb)  02/06/16 83.6 kg (184 lb 4.9 oz)    PHYSICAL EXAMINATION:  Physical Exam  Vitals reviewed. Constitutional: She is oriented to person, place, and time. She appears well-developed and well-nourished. No distress.  HENT:  Head: Normocephalic and atraumatic.  Mouth/Throat: Oropharynx is clear and moist.  Eyes: Pupils are equal, round, and reactive to light. Conjunctivae and EOM are normal. No scleral icterus.  Neck: Normal range of motion. Neck supple. No JVD present. No thyromegaly present.  Cardiovascular: Normal rate, regular rhythm and intact distal pulses. Exam reveals no gallop and no friction rub.  No murmur heard. Respiratory:  She is in respiratory distress (On BiPAP). She has wheezes. She has no rales.  GI: Soft. Bowel sounds are normal. She exhibits no distension. There is no tenderness.  Musculoskeletal: Normal range of motion. She exhibits no edema.  No arthritis, no gout  Lymphadenopathy:    She has no cervical adenopathy.  Neurological: She is alert and oriented to person, place, and time. No cranial nerve deficit.  No dysarthria, no aphasia  Skin: Skin is warm and dry. No rash noted. No erythema.  Psychiatric: She has a normal mood and affect. Her behavior is normal. Judgment and thought content normal.    LABORATORY PANEL:   CBC No results for input(s): WBC, HGB, HCT, PLT in the last 168 hours. ------------------------------------------------------------------------------------------------------------------  Chemistries  Recent Labs  Lab 02/19/18 2226  NA 142  K 3.6  CL 107  CO2 27  GLUCOSE 191*  BUN 25*  CREATININE 0.91  CALCIUM 8.9  AST 19  ALT <5*  ALKPHOS 59  BILITOT 0.5   ------------------------------------------------------------------------------------------------------------------  Cardiac Enzymes No results for input(s): TROPONINI in the last 168 hours. ------------------------------------------------------------------------------------------------------------------  RADIOLOGY:  Dg Chest Portable 1 View  Result Date: 02/19/2018 CLINICAL DATA:  Respiratory distress. Cough, lethargy, desaturation. History of asthma and COPD. EXAM: PORTABLE CHEST 1 VIEW COMPARISON:  02/06/2016 FINDINGS: Shallow inspiration with linear atelectasis in the lung bases. Heart size and pulmonary vascularity are normal for technique. No airspace disease or consolidation in the lungs. No blunting of costophrenic angles. No pneumothorax. IMPRESSION: Shallow inspiration.  No evidence of active pulmonary disease. Electronically Signed   By: Burman NievesWilliam  Stevens M.D.   On: 02/19/2018 23:32    EKG:   Orders  placed or performed during the hospital encounter of 02/19/18  . EKG 12-Lead  . EKG 12-Lead    IMPRESSION AND PLAN:  Principal Problem:   Asthma exacerbation -IV steroids, IV mag given, nebulizer treatment, other supportive treatment PRN Active Problems:   UTI -UA suspicious for UTI, patient states she has been having some dysuria, treat with Levaquin   HTN (hypertension) -continue home meds   Diabetes (HCC) -sliding scale insulin with corresponding glucose checks   HLD (hyperlipidemia) -home dose antilipid   Parkinson's disease (HCC) -home dose Sinemet  Chart review performed and case discussed with ED provider. Labs, imaging and/or ECG reviewed by provider and discussed with patient/family. Management plans discussed with the patient and/or family.  DVT PROPHYLAXIS: SubQ lovenox  GI PROPHYLAXIS: None  ADMISSION STATUS: Inpatient  CODE STATUS: Full  TOTAL TIME TAKING CARE OF THIS PATIENT: 45 minutes.   Kailynne Ferrington FIELDING 02/19/2018, 11:44 PM  [facility]  Office  [phone]  CC: Primary care physician; System, Pcp Not In  Note:  This document was prepared using Dragon voice recognition software and may include unintentional dictation errors.

## 2018-02-19 NOTE — ED Provider Notes (Signed)
Patient reevaluated.  Tolerating BiPAP well.  In no distress.  Family at bedside.  Updated patient and family on results, lungs now clear without wheezing.  Oxygen saturation 98-100% on BiPAP.  She is able speak 1-2 word over.  Chest x-ray reviewed by me, no acute findings denoted.   Urinalysis also reviewed, some white cells and rare bacteria but also many red cells as well as squames.  Sent for culture.  Patient has received Levaquin already.  Presently the patient is reporting improvement.  ----------------------------------------- 11:20 PM on 02/19/2018 -----------------------------------------  Patient appears stable for admission.  Paged hospitalist for admission at this time.   Sharyn CreamerQuale, Yacob Wilkerson, MD 02/19/18 2321

## 2018-02-19 NOTE — Progress Notes (Signed)
Pharmacy Antibiotic Note  Hannah Jackson is a 84 y.o. female admitted on 02/19/2018 with possible UTI/asthma exacerbation.  Pharmacy has been consulted for Levaquin dosing.  Plan: Levaquin 750 mg q 48 hours ordered.  Height: 5\' 4"  (162.6 cm) Weight: 200 lb (90.7 kg) IBW/kg (Calculated) : 54.7  Temp (24hrs), Avg:98.5 F (36.9 C), Min:98.5 F (36.9 C), Max:98.5 F (36.9 C)  Recent Labs  Lab 02/19/18 2226  CREATININE 0.91  LATICACIDVEN 1.8    Estimated Creatinine Clearance: 47.5 mL/min (by C-G formula based on SCr of 0.91 mg/dL).    No Known Allergies  Antimicrobials this admission: Levaquin 4/3  >>    >>   Dose adjustments this admission:   Microbiology results: 4/3 BCx: pending 4/3 UCx: pending       4/3 UA: LE(tr)  NO2(-)  WBC 6-30 Thank you for allowing pharmacy to be a part of this patient's care.  Leno Mathes S 02/19/2018 11:51 PM

## 2018-02-20 ENCOUNTER — Encounter: Payer: Self-pay | Admitting: Critical Care Medicine

## 2018-02-20 DIAGNOSIS — R0602 Shortness of breath: Secondary | ICD-10-CM

## 2018-02-20 DIAGNOSIS — N390 Urinary tract infection, site not specified: Secondary | ICD-10-CM | POA: Diagnosis present

## 2018-02-20 DIAGNOSIS — J449 Chronic obstructive pulmonary disease, unspecified: Secondary | ICD-10-CM | POA: Diagnosis present

## 2018-02-20 LAB — GLUCOSE, CAPILLARY
GLUCOSE-CAPILLARY: 181 mg/dL — AB (ref 65–99)
GLUCOSE-CAPILLARY: 115 mg/dL — AB (ref 65–99)
GLUCOSE-CAPILLARY: 51 mg/dL — AB (ref 65–99)
GLUCOSE-CAPILLARY: 107 mg/dL — AB (ref 65–99)
GLUCOSE-CAPILLARY: 150 mg/dL — AB (ref 65–99)
Glucose-Capillary: 56 mg/dL — ABNORMAL LOW (ref 65–99)
Glucose-Capillary: 94 mg/dL (ref 65–99)
Glucose-Capillary: 100 mg/dL — ABNORMAL HIGH (ref 65–99)
Glucose-Capillary: 75 mg/dL (ref 65–99)

## 2018-02-20 LAB — BASIC METABOLIC PANEL
Anion gap: 6 (ref 5–15)
BUN: 24 mg/dL — ABNORMAL HIGH (ref 6–20)
CALCIUM: 8.5 mg/dL — AB (ref 8.9–10.3)
CO2: 28 mmol/L (ref 22–32)
CREATININE: 0.73 mg/dL (ref 0.44–1.00)
Chloride: 108 mmol/L (ref 101–111)
Glucose, Bld: 189 mg/dL — ABNORMAL HIGH (ref 65–99)
Potassium: 4.4 mmol/L (ref 3.5–5.1)
SODIUM: 142 mmol/L (ref 135–145)

## 2018-02-20 LAB — CBC
HCT: 38 % (ref 35.0–47.0)
Hemoglobin: 12.2 g/dL (ref 12.0–16.0)
MCH: 27.8 pg (ref 26.0–34.0)
MCHC: 32.2 g/dL (ref 32.0–36.0)
MCV: 86.4 fL (ref 80.0–100.0)
PLATELETS: 198 10*3/uL (ref 150–440)
RBC: 4.39 MIL/uL (ref 3.80–5.20)
RDW: 15.2 % — ABNORMAL HIGH (ref 11.5–14.5)
WBC: 10.1 10*3/uL (ref 3.6–11.0)

## 2018-02-20 LAB — MRSA PCR SCREENING: MRSA by PCR: NEGATIVE

## 2018-02-20 MED ORDER — CARBIDOPA-LEVODOPA 25-100 MG PO TABS
1.0000 | ORAL_TABLET | Freq: Three times a day (TID) | ORAL | Status: DC
  Administered 2018-02-20 – 2018-02-24 (×13): 1 via ORAL
  Filled 2018-02-20 (×17): qty 1

## 2018-02-20 MED ORDER — DEXTROSE 10 % IV SOLN
INTRAVENOUS | Status: DC
  Administered 2018-02-20 – 2018-02-22 (×3): via INTRAVENOUS

## 2018-02-20 MED ORDER — ACETAMINOPHEN 325 MG PO TABS
650.0000 mg | ORAL_TABLET | Freq: Four times a day (QID) | ORAL | Status: DC | PRN
  Administered 2018-02-23: 650 mg via ORAL
  Filled 2018-02-20: qty 2

## 2018-02-20 MED ORDER — METHYLPREDNISOLONE SODIUM SUCC 40 MG IJ SOLR
40.0000 mg | Freq: Two times a day (BID) | INTRAMUSCULAR | Status: DC
Start: 2018-02-20 — End: 2018-02-23
  Administered 2018-02-20 – 2018-02-23 (×5): 40 mg via INTRAVENOUS
  Filled 2018-02-20 (×5): qty 1

## 2018-02-20 MED ORDER — ENOXAPARIN SODIUM 40 MG/0.4ML ~~LOC~~ SOLN
40.0000 mg | SUBCUTANEOUS | Status: DC
  Administered 2018-02-20 – 2018-02-23 (×4): 40 mg via SUBCUTANEOUS
  Filled 2018-02-20 (×4): qty 0.4

## 2018-02-20 MED ORDER — INSULIN ASPART 100 UNIT/ML ~~LOC~~ SOLN: 0.0000 [IU] | Freq: Three times a day (TID) | SUBCUTANEOUS | Status: DC

## 2018-02-20 MED ORDER — LEVOFLOXACIN IN D5W 750 MG/150ML IV SOLN
750.0000 mg | INTRAVENOUS | Status: DC
  Administered 2018-02-20: 750 mg via INTRAVENOUS
  Filled 2018-02-20 (×2): qty 150

## 2018-02-20 MED ORDER — IPRATROPIUM-ALBUTEROL 0.5-2.5 (3) MG/3ML IN SOLN: 3.0000 mL | RESPIRATORY_TRACT | Status: DC | PRN

## 2018-02-20 MED ORDER — ACETAMINOPHEN 650 MG RE SUPP: 650.0000 mg | Freq: Four times a day (QID) | RECTAL | Status: DC | PRN

## 2018-02-20 MED ORDER — ASPIRIN EC 81 MG PO TBEC
81.0000 mg | DELAYED_RELEASE_TABLET | Freq: Every day | ORAL | Status: DC
  Administered 2018-02-20 – 2018-02-24 (×5): 81 mg via ORAL
  Filled 2018-02-20 (×5): qty 1

## 2018-02-20 MED ORDER — FAMOTIDINE 20 MG PO TABS
20.0000 mg | ORAL_TABLET | Freq: Two times a day (BID) | ORAL | Status: DC
  Administered 2018-02-20 – 2018-02-24 (×9): 20 mg via ORAL
  Filled 2018-02-20 (×9): qty 1

## 2018-02-20 MED ORDER — METHYLPREDNISOLONE SODIUM SUCC 125 MG IJ SOLR: 60.0000 mg | Freq: Four times a day (QID) | INTRAMUSCULAR | Status: DC

## 2018-02-20 MED ORDER — NORTRIPTYLINE HCL 25 MG PO CAPS
50.0000 mg | ORAL_CAPSULE | Freq: Every evening | ORAL | Status: DC
  Administered 2018-02-20 – 2018-02-23 (×3): 50 mg via ORAL
  Filled 2018-02-20 (×5): qty 2

## 2018-02-20 MED ORDER — DEXTROSE 50 % IV SOLN
INTRAVENOUS | Status: AC
  Administered 2018-02-20: 12:00:00
  Filled 2018-02-20: qty 50

## 2018-02-20 MED ORDER — BUDESONIDE 0.25 MG/2ML IN SUSP
0.2500 mg | Freq: Two times a day (BID) | RESPIRATORY_TRACT | Status: DC
  Administered 2018-02-20 – 2018-02-24 (×9): 0.25 mg via RESPIRATORY_TRACT
  Filled 2018-02-20 (×10): qty 2

## 2018-02-20 MED ORDER — ONDANSETRON HCL 4 MG/2ML IJ SOLN: 4.0000 mg | Freq: Four times a day (QID) | INTRAMUSCULAR | Status: DC | PRN

## 2018-02-20 MED ORDER — DEXTROSE 50 % IV SOLN
50.0000 mL | Freq: Once | INTRAVENOUS | Status: AC
  Administered 2018-02-20: 50 mL via INTRAVENOUS

## 2018-02-20 MED ORDER — LISINOPRIL 5 MG PO TABS
5.0000 mg | ORAL_TABLET | Freq: Every day | ORAL | Status: DC
  Administered 2018-02-20 – 2018-02-23 (×4): 5 mg via ORAL
  Filled 2018-02-20 (×5): qty 1

## 2018-02-20 MED ORDER — IPRATROPIUM-ALBUTEROL 0.5-2.5 (3) MG/3ML IN SOLN
3.0000 mL | Freq: Four times a day (QID) | RESPIRATORY_TRACT | Status: DC
  Administered 2018-02-20 – 2018-02-22 (×12): 3 mL via RESPIRATORY_TRACT
  Filled 2018-02-20 (×12): qty 3

## 2018-02-20 MED ORDER — PREGABALIN 50 MG PO CAPS
75.0000 mg | ORAL_CAPSULE | Freq: Two times a day (BID) | ORAL | Status: DC
  Administered 2018-02-20 – 2018-02-24 (×8): 75 mg via ORAL
  Filled 2018-02-20 (×9): qty 1

## 2018-02-20 MED ORDER — ONDANSETRON HCL 4 MG PO TABS: 4.0000 mg | ORAL_TABLET | Freq: Four times a day (QID) | ORAL | Status: DC | PRN

## 2018-02-20 MED ORDER — INSULIN ASPART 100 UNIT/ML ~~LOC~~ SOLN: 0.0000 [IU] | Freq: Every day | SUBCUTANEOUS | Status: DC

## 2018-02-20 MED ORDER — GUAIFENESIN-DM 100-10 MG/5ML PO SYRP: 5.0000 mL | ORAL_SOLUTION | ORAL | Status: DC | PRN

## 2018-02-20 MED ORDER — HYDRALAZINE HCL 20 MG/ML IJ SOLN
10.0000 mg | INTRAMUSCULAR | Status: DC | PRN
  Administered 2018-02-20: 10 mg via INTRAVENOUS
  Filled 2018-02-20: qty 1

## 2018-02-20 MED ORDER — GUAIFENESIN-DM 100-10 MG/5ML PO SYRP
5.0000 mL | ORAL_SOLUTION | ORAL | Status: DC | PRN
  Filled 2018-02-20: qty 5

## 2018-02-20 MED ORDER — DEXTROSE 50 % IV SOLN
INTRAVENOUS | Status: AC
  Administered 2018-02-20: 50 mL via INTRAVENOUS
  Filled 2018-02-20: qty 50

## 2018-02-20 MED ORDER — DEXTROSE 50 % IV SOLN
1.0000 | Freq: Once | INTRAVENOUS | Status: AC
  Administered 2018-02-20: 50 mL via INTRAVENOUS

## 2018-02-20 NOTE — Progress Notes (Addendum)
Spoke with Susy FrizzleMatt, Pharmacist concerning the lack of verification of the lovenox 30mg  Sub-q.  He stated that he was "waiting on her labs." for varification.

## 2018-02-20 NOTE — Progress Notes (Signed)
RN notified Dr Duanne LimerickSamaan of patient fsbs dropping to 51, received 1amp of d50, did not improve, received additional dose of d50 with fsbs now at 94.  Dr Duanne LimerickSamaan gave order to discontinue sliding scale insulin, start D10@60ml /hr and check fsbs q2h until fsbs stablizes between 90-150 upon which will check fsbs q4hr.

## 2018-02-20 NOTE — Progress Notes (Signed)
Patient ID: Hannah Jackson Reason, female   DOB: 07-24-1928, 84 y.o.   MRN: [id number]  [facility] PROGRESS NOTE  Hannah Jackson Kubitz [id number] DOB: 07-24-1928 DOA: 02/19/2018 PCP: System, Pcp Not In  HPI/Subjective: Patient seen earlier while on BiPAP.  Hard to understand patient with the BiPAP mask on.  Came in with shortness of breath.  She states that she is hoping to go home tomorrow.  Some cough.  Objective: Vitals:   02/20/18 1400 02/20/18 1500  BP: (!) 165/64 (!) 158/53  Pulse: 73 74  Resp: 14 16  Temp:    SpO2: 100% 100%    Filed Weights   02/19/18 2223 02/20/18 0300  Weight: 90.7 kg (200 lb) 90 kg (198 lb 6.6 oz)    ROS: Review of Systems  Unable to perform ROS: Acuity of condition  Respiratory: Positive for shortness of breath.   Cardiovascular: Negative for chest pain.  Gastrointestinal: Negative for abdominal pain, diarrhea, nausea and vomiting.  Genitourinary: Negative for dysuria.  Musculoskeletal: Negative for joint pain.   Exam: Physical Exam  HENT:  Nose: No mucosal edema.  Mouth/Throat: No oropharyngeal exudate or posterior oropharyngeal edema.  Eyes: Pupils are equal, round, and reactive to light. Conjunctivae, EOM and lids are normal.  Neck: No JVD present. Carotid bruit is not present. No edema present. No thyroid mass and no thyromegaly present.  Cardiovascular: S1 normal and S2 normal. Exam reveals no gallop.  No murmur heard. Pulses:      Dorsalis pedis pulses are 2+ on the right side, and 2+ on the left side.  Respiratory: No respiratory distress. She has decreased breath sounds in the right lower field and the left lower field. She has no wheezes. She has rhonchi in the right lower field and the left lower field. She has no rales.  GI: Soft. Bowel sounds are normal. There is no tenderness.  Musculoskeletal:       Right ankle: She exhibits swelling.       Left ankle: She exhibits swelling.  Lymphadenopathy:    She has no cervical adenopathy.   Neurological: She is alert. No cranial nerve deficit.  Skin: Skin is warm. No rash noted. Nails show no clubbing.  Psychiatric: She has a normal mood and affect.      Data Reviewed: Basic Metabolic Panel: Recent Labs  Lab 02/19/18 2226 02/20/18 0708  NA 142 142  K 3.6 4.4  CL 107 108  CO2 27 28  GLUCOSE 191* 189*  BUN 25* 24*  CREATININE 0.91 0.73  CALCIUM 8.9 8.5*   Liver Function Tests: Recent Labs  Lab 02/19/18 2226  AST 19  ALT <5*  ALKPHOS 59  BILITOT 0.5  PROT 7.5  ALBUMIN 4.4   CBC: Recent Labs  Lab 02/20/18 0708  WBC 10.1  HGB 12.2  HCT 38.0  MCV 86.4  PLT 198    BNP (last 3 results) Recent Labs    02/19/18 2254  BNP 165.0*    CBG: Recent Labs  Lab 02/20/18 1142 02/20/18 1218 02/20/18 1220 02/20/18 1249 02/20/18 1459  GLUCAP 51* 43* 56* 94 100*    Recent Results (from the past 240 hour(s))  Blood culture (routine x 2)     Status: None (Preliminary result)   Collection Time: 02/19/18 10:27 PM  Result Value Ref Range Status   Specimen Description BLOOD RIGHT HAND  Final   Special Requests   Final    BOTTLES DRAWN AEROBIC AND ANAEROBIC Blood Culture adequate volume  Culture   Final    NO GROWTH < 12 HOURS Performed at [facility], [address] [place], [place] [address]    Report Status PENDING  Incomplete  Blood culture (routine x 2)     Status: None (Preliminary result)   Collection Time: 02/19/18 10:27 PM  Result Value Ref Range Status   Specimen Description BLOOD LEFT HAND  Final   Special Requests   Final    BOTTLES DRAWN AEROBIC AND ANAEROBIC Blood Culture results may not be optimal due to an excessive volume of blood received in culture bottles   Culture   Final    NO GROWTH < 12 HOURS Performed at [facility], [address], [place], [place] [address]    Report Status PENDING  Incomplete  MRSA PCR Screening     Status: None   Collection Time: 02/20/18  3:47 AM  Result Value Ref Range  Status   MRSA by PCR NEGATIVE NEGATIVE Final    Comment:        The GeneXpert MRSA Assay (FDA approved for NASAL specimens only), is one component of a comprehensive MRSA colonization surveillance program. It is not intended to diagnose MRSA infection nor to guide or monitor treatment for MRSA infections. Performed at [facility], [address] [place], [place] [address]      Studies: Dg Chest Portable 1 View  Result Date: 02/19/2018 CLINICAL DATA:  Respiratory distress. Cough, lethargy, desaturation. History of asthma and COPD. EXAM: PORTABLE CHEST 1 VIEW COMPARISON:  02/06/2016 FINDINGS: Shallow inspiration with linear atelectasis in the lung bases. Heart size and pulmonary vascularity are normal for technique. No airspace disease or consolidation in the lungs. No blunting of costophrenic angles. No pneumothorax. IMPRESSION: Shallow inspiration.  No evidence of active pulmonary disease. Electronically Signed   By: Burman Nieves Jackson.D.   On: 02/19/2018 23:32    Scheduled Meds: . aspirin EC  81 mg Oral Daily  . budesonide (PULMICORT) nebulizer solution  0.25 mg Nebulization BID  . carbidopa-levodopa  1 tablet Oral TID  . enoxaparin (LOVENOX) injection  40 mg Subcutaneous Q24H  . famotidine  20 mg Oral BID  . ipratropium-albuterol  3 mL Nebulization Q6H  . lisinopril  5 mg Oral Daily  . methylPREDNISolone (SOLU-MEDROL) injection  40 mg Intravenous Q12H  . nortriptyline  50 mg Oral QPM  . pregabalin  75 mg Oral BID   Continuous Infusions: . dextrose 60 mL/hr at 02/20/18 1310  . levofloxacin (LEVAQUIN) IV      Assessment/Plan:  1. Acute hypoxic respiratory failure on BiPAP 40% oxygen in order to oxygenate 2. Asthma exacerbation on Solu-Medrol and nebulizer treatments 3. Acute cystitis with hematuria on Levaquin 4. Hypoglycemia with diabetes despite being on steroids.  On dextrose drip 5. Essential hypertension on lisinopril 6. Parkinson's disease on  Sinemet  Code Status:     Code Status Orders  (From admission, onward)        Start     Ordered   02/20/18 0337  Full code  Continuous     02/20/18 0336    Code Status History    This patient has a current code status but no historical code status.     Family Communication: As per critical care specialist Disposition Plan: To be determined  Consultants:  Critical care specialist  Antibiotics:  Levaquin  Time spent: 28 minutes  Xian Alves [employer]

## 2018-02-20 NOTE — Progress Notes (Signed)
Pharmacy Antibiotic Note  Hannah Jackson is a 84 y.o. female admitted on 02/19/2018 with possible UTI/asthma exacerbation.  Pharmacy has been consulted for Levaquin dosing.  Plan: Will increase Levaquin dosing to 750 mg q 24 hours.   Height: 5\' 4"  (162.6 cm) Weight: 198 lb 6.6 oz (90 kg) IBW/kg (Calculated) : 54.7  Temp (24hrs), Avg:98.1 F (36.7 C), Min:97.4 F (36.3 C), Max:98.5 F (36.9 C)  Recent Labs  Lab 02/19/18 2226 02/20/18 0708  WBC  --  10.1  CREATININE 0.91 0.73  LATICACIDVEN 1.8  --     Estimated Creatinine Clearance: 53.8 mL/min (by C-G formula based on SCr of 0.73 mg/dL).    Allergies  Allergen Reactions  . Penicillins     Antimicrobials this admission: Levaquin 4/3  >>    Dose adjustments this admission:   Microbiology results: 4/3 BCx: NGTD 4/3 UCx: pending  4/4 MRSA PCR: negative  Thank you for allowing pharmacy to be a part of this patient's care.  Luisa HartChristy, Ryeleigh Santore D 02/20/2018 1:46 PM

## 2018-02-20 NOTE — Progress Notes (Signed)
eLink Physician-Brief Progress Note Patient Name: Hannah HighlandKatie M Jackson DOB: [birth year] MRN: [id number]   Date of Service  02/20/2018  HPI/Events of Note  84 yo F with parkinson's dse, dementia and asthma admitted earlier last PM with asthma exacerbation and acute resp failure. Baseline dementia otherwise mental status stable. Influenzanegative.  eICU Interventions  Duonebs/ Solumedrol/ Levaquine/ BIPAP        Okoronkwo U Ogan 02/20/2018, 3:56 AM

## 2018-02-20 NOTE — Progress Notes (Signed)
Pt transported to ICU 3 on Bipap without incident. Pt remains on Bipap and is tol well. Report given to ICU RT.

## 2018-02-20 NOTE — Consult Note (Signed)
Name: Hannah Jackson MRN: [id number] DOB: September 09, 1928    ADMISSION DATE:  02/19/2018 CONSULTATION DATE: 02/19/2018  REFERRING MD : Dr. Anne Hahn   CHIEF COMPLAINT: Shortness of Breath   BRIEF PATIENT DESCRIPTION: 84 yo female admitted with acute on chronic hypoxic respiratory failure secondary to asthma  exacerbation requiring Bipap and UTI   SIGNIFICANT EVENTS  04/4-Pt admitted to stepdown unit   STUDIES:  None   HISTORY OF PRESENT ILLNESS:   This is an 84 yo female with a PMH of Parkinson's Disease, Dementia, HTN, Hyperlipidemia, Diabetes Mellitus, Esophageal Stenosis, and Asthma.  She presented to [facility] ER on 04/3 via EMS with progressive wheezing and shortness of breath onset of symptom 3-4 days prior to presentation according to pts daughter.  Per ER notes the pts home health nurse attempted to administered a breathing treatment, however pt refused. EMS notified due to worsening respiratory failure.  Upon EMS arrival pts O2 sats were 92% on 4L O2, however due to respiratory failure she was placed on NRB.  EMS administered 1 albuterol and 2 duoneb treatments, 2 mg iv magnesium, and 125 mg iv solumedrol. The pt was recently started on prednisone due to symptoms.  Upon arrival to the ER O2 sats were 100%, however she was transitioned to Bipap due to increased work of breathing.  CXR and Influenza PCR negative.  She was subsequently admitted to the stepdown unit by hospitalist team for further workup and treatment.   PAST MEDICAL HISTORY :   has a past medical history of Asthma, COPD (chronic obstructive pulmonary disease) (HCC), Diabetes mellitus without complication (HCC), HLD (hyperlipidemia), HTN (hypertension), and Parkinson's disease (HCC).  has a past surgical history that includes Back surgery. Prior to Admission medications   Medication Sig Start Date End Date Taking? Authorizing Provider  acetaminophen (TYLENOL) 650 MG CR tablet Take 650 mg by mouth 2 (two) times daily. For arthritis  pain/stiffness   Yes [provider]  albuterol (PROVENTIL) (2.5 MG/3ML) 0.083% nebulizer solution Take 2.5 mg by nebulization every 6 (six) hours as needed for wheezing or shortness of breath.   Yes [provider]  aspirin EC 81 MG tablet Take 81 mg by mouth daily.   Yes [provider]  calcium carbonate (TUMS - DOSED IN MG ELEMENTAL CALCIUM) 500 MG chewable tablet Chew 1-2 tablets by mouth 3 (three) times daily as needed for indigestion or heartburn.   Yes [provider]  carbidopa-levodopa (SINEMET CR) 50-200 MG tablet Take 1 tablet by mouth 2 (two) times daily. In the morning and at noon   Yes [provider]  carbidopa-levodopa (SINEMET IR) 25-100 MG tablet Take 1 tablet by mouth 3 (three) times daily.   Yes [provider]  carboxymethylcellul-glycerin (OPTIVE) 0.5-0.9 % ophthalmic solution Place 1-2 drops into both eyes 4 (four) times daily as needed for dry eyes.   Yes [provider]  cholecalciferol (VITAMIN D) 1000 units tablet Take 1,000 Units by mouth daily.   Yes [provider]  diclofenac sodium (VOLTAREN) 1 % GEL Apply 2 g topically 3 (three) times daily as needed (for wrist pain).   Yes [provider]  docusate sodium (COLACE) 100 MG capsule Take 100 mg by mouth daily as needed for mild constipation.   Yes [provider]  guaiFENesin (MUCINEX) 600 MG 12 hr tablet Take 600 mg by mouth 2 (two) times daily as needed.   Yes [provider]  ibandronate (BONIVA) 150 MG tablet Take 150 mg  by mouth every 30 (thirty) days. Take in the morning with a full glass of water, on an empty stomach, and do not take anything else by mouth or lie down for the next 60 min.   Yes [provider]  lidocaine (LIDODERM) 5 % Place 1 patch onto the skin daily. Remove & Discard patch within 12 hours   Yes [provider]  lisinopril (PRINIVIL,ZESTRIL) 5 MG tablet Take 5 mg by mouth daily.    Yes [provider]  mirabegron ER (MYRBETRIQ) 25 MG TB24 tablet Take 25 mg by mouth daily.   Yes [provider]  nortriptyline (PAMELOR) 50 MG capsule Take 50 mg by mouth every evening.   Yes [provider]  oxymetazoline (AFRIN) 0.05 % nasal spray Place 2 sprays into both nostrils as needed for congestion.   Yes [provider]  polyethylene glycol (MIRALAX / GLYCOLAX) packet Take 17 g by mouth daily as needed.   Yes [provider]  predniSONE (DELTASONE) 20 MG tablet Take 60 mg by mouth daily. For 5 days for breathing 02/17/18  Yes [provider]  pregabalin (LYRICA) 75 MG capsule Take 75 mg by mouth 2 (two) times daily.   Yes [provider]  ranitidine (ZANTAC) 150 MG tablet Take 150 mg by mouth 2 (two) times daily.   Yes [provider]  senna-docusate (SENOKOT-S) 8.6-50 MG tablet Take 1 tablet by mouth daily.   Yes [provider]  sodium chloride (AYR) 0.65 % nasal spray Place 2 sprays into the nose. 2 sprays in each nostril 3-4 times daily   Yes [provider]  Zinc Oxide 13 % CREA Apply 1 application topically as needed for irritation (apply cream to buttocks three times daily and as needed as a barrier cream).   Yes [provider]   Allergies  Allergen Reactions  . Penicillins     FAMILY HISTORY:  family history includes CAD in her father; Heart attack in her father; Hypertension in her father; Leukemia in her mother; Stroke in her mother. SOCIAL HISTORY:  reports that she has never smoked. She has never used smokeless tobacco. She reports that she does not drink alcohol or use drugs.  REVIEW OF SYSTEMS:  Unable to assess pt on Bipap confused  SUBJECTIVE:  Unable to assess pt on Bipap confused   VITAL SIGNS: Temp:  [98.5 F (36.9 C)] 98.5 F (36.9 C) (04/03 2239) Pulse Rate:  [94-114] 94 (04/04 0100) Resp:  [16-22] 17 (04/04 0100) BP: (156-168)/(57-78) 162/78 (04/04  0100) SpO2:  [100 %] 100 % (04/04 0100) Weight:  [90.7 kg (200 lb)] 90.7 kg (200 lb) (04/03 2223)  PHYSICAL EXAMINATION: General: chronically ill appearing female, NAD on Bipap  Neuro: lethargic, follows commands HEENT: supple, no JVD Cardiovascular: nsr, rrr, no R/G Lungs: shallow respirations, diminished throughout, even, non labored  Abdomen: +BS x4, soft, non tender, non distended  Musculoskeletal: normal tone, trace bilateral lower extremity edema  Skin: intact no   Recent Labs  Lab 02/19/18 2226  NA 142  K 3.6  CL 107  CO2 27  BUN 25*  CREATININE 0.91  GLUCOSE 191*   No results for input(s): HGB, HCT, WBC, PLT in the last 168 hours. Dg Chest Portable 1 View  Result Date: 02/19/2018 CLINICAL DATA:  Respiratory distress. Cough, lethargy, desaturation. History of asthma and COPD. EXAM: PORTABLE CHEST 1 VIEW COMPARISON:  02/06/2016 FINDINGS: Shallow inspiration with linear atelectasis in the lung bases. Heart size and pulmonary  vascularity are normal for technique. No airspace disease or consolidation in the lungs. No blunting of costophrenic angles. No pneumothorax. IMPRESSION: Shallow inspiration.  No evidence of active pulmonary disease. Electronically Signed   By: Burman NievesWilliam  Stevens M.D.   On: 02/19/2018 23:32    ASSESSMENT / PLAN: Acute on chronic hypoxic respiratory failure secondary to asthma exacerbation  UTI  Anemia without obvious acute blood loss Thrombocytopenia  Diabetes Mellitus  Hx: Parkinson's Disease, Dementia, Hyperlipidemia, and HTN  P: Prn Bipap for dyspnea and/or hypoxia  Scheduled and prn bronchodilator therapy  IV and nebulized steroids  Prn CXR Continuous telemetry monitoring  Continue outpatient cardiac medications  Trend WBC and monitor fever curve Follow cultures  Continue levaquin  VTE px: subq lovenox  Trend CBC  Monitor s/sx of bleeding  Transfuse for hgb<7 Trend BMP Replace electrolytes as indicated  Monitor UOP  SSI   Sonda Rumbleana  Jenevie Casstevens, AGNP  Pulmonary/Critical Care Pager [phone] (please enter 7 digits) PCCM Consult Pager [phone] (please enter 7 digits)

## 2018-02-21 ENCOUNTER — Other Ambulatory Visit: Payer: Self-pay

## 2018-02-21 LAB — BASIC METABOLIC PANEL
ANION GAP: 6 (ref 5–15)
BUN: 20 mg/dL (ref 6–20)
CO2: 29 mmol/L (ref 22–32)
Calcium: 8.5 mg/dL — ABNORMAL LOW (ref 8.9–10.3)
Chloride: 106 mmol/L (ref 101–111)
Creatinine, Ser: 0.8 mg/dL (ref 0.44–1.00)
GFR calc Af Amer: >60 mL/min (ref >60)
Glucose, Bld: 148 mg/dL — ABNORMAL HIGH (ref 65–99)
Potassium: 3.9 mmol/L (ref 3.5–5.1)
SODIUM: 141 mmol/L (ref 135–145)

## 2018-02-21 LAB — BLOOD CULTURE ID PANEL (REFLEXED)
Acinetobacter baumannii: NOT DETECTED
CANDIDA TROPICALIS: NOT DETECTED
Candida albicans: NOT DETECTED
Candida glabrata: NOT DETECTED
Candida krusei: NOT DETECTED
Candida parapsilosis: NOT DETECTED
Enterobacter cloacae complex: NOT DETECTED
Enterobacteriaceae species: NOT DETECTED
Enterococcus species: NOT DETECTED
Escherichia coli: NOT DETECTED
HAEMOPHILUS INFLUENZAE: NOT DETECTED
KLEBSIELLA PNEUMONIAE: NOT DETECTED
Klebsiella oxytoca: NOT DETECTED
Listeria monocytogenes: NOT DETECTED
METHICILLIN RESISTANCE: NOT DETECTED
NEISSERIA MENINGITIDIS: NOT DETECTED
PROTEUS SPECIES: NOT DETECTED
Pseudomonas aeruginosa: NOT DETECTED
STAPHYLOCOCCUS AUREUS BCID: NOT DETECTED
STAPHYLOCOCCUS SPECIES: DETECTED — AB
STREPTOCOCCUS SPECIES: NOT DETECTED
Serratia marcescens: NOT DETECTED
Streptococcus agalactiae: NOT DETECTED
Streptococcus pneumoniae: NOT DETECTED
Streptococcus pyogenes: NOT DETECTED

## 2018-02-21 LAB — GLUCOSE, CAPILLARY
GLUCOSE-CAPILLARY: 94 mg/dL (ref 65–99)
GLUCOSE-CAPILLARY: 68 mg/dL (ref 65–99)
GLUCOSE-CAPILLARY: 80 mg/dL (ref 65–99)
GLUCOSE-CAPILLARY: 52 mg/dL — AB (ref 65–99)
Glucose-Capillary: 111 mg/dL — ABNORMAL HIGH (ref 65–99)
Glucose-Capillary: 163 mg/dL — ABNORMAL HIGH (ref 65–99)
Glucose-Capillary: 253 mg/dL — ABNORMAL HIGH (ref 65–99)
Glucose-Capillary: 55 mg/dL — ABNORMAL LOW (ref 65–99)
Glucose-Capillary: 68 mg/dL (ref 65–99)

## 2018-02-21 LAB — HEMOGLOBIN A1C
Hgb A1c MFr Bld: 6 % — ABNORMAL HIGH (ref 4.8–5.6)
MEAN PLASMA GLUCOSE: 125.5 mg/dL

## 2018-02-21 LAB — URINE CULTURE
Culture: NO GROWTH
Special Requests: NORMAL

## 2018-02-21 MED ORDER — CARBIDOPA-LEVODOPA ER 50-200 MG PO TBCR
1.0000 | EXTENDED_RELEASE_TABLET | ORAL | Status: DC
  Administered 2018-02-21 – 2018-02-24 (×7): 1 via ORAL
  Filled 2018-02-21 (×7): qty 1

## 2018-02-21 NOTE — Progress Notes (Signed)
PHARMACY - PHYSICIAN COMMUNICATION CRITICAL VALUE ALERT - BLOOD CULTURE IDENTIFICATION (BCID)  Hannah Jackson is an 84 y.o. female who [facility] with a chief complaint of   Assessment:  1/4 anaerobic GPC, Staph spp, mecA(-) (include suspected source if known)  Name of physician (or Provider) ContactedPola Corn: Elink MD  Current antibiotics: Levaquin  Changes to prescribed antibiotics recommended:  n/a  Results for orders placed or performed during the hospital encounter of 02/19/18  Blood Culture ID Panel (Reflexed) (Collected: 02/19/2018 10:27 PM)  Result Value Ref Range   Enterococcus species NOT DETECTED NOT DETECTED   Listeria monocytogenes NOT DETECTED NOT DETECTED   Staphylococcus species DETECTED (A) NOT DETECTED   Staphylococcus aureus NOT DETECTED NOT DETECTED   Methicillin resistance NOT DETECTED NOT DETECTED   Streptococcus species NOT DETECTED NOT DETECTED   Streptococcus agalactiae NOT DETECTED NOT DETECTED   Streptococcus pneumoniae NOT DETECTED NOT DETECTED   Streptococcus pyogenes NOT DETECTED NOT DETECTED   Acinetobacter baumannii NOT DETECTED NOT DETECTED   Enterobacteriaceae species NOT DETECTED NOT DETECTED   Enterobacter cloacae complex NOT DETECTED NOT DETECTED   Escherichia coli NOT DETECTED NOT DETECTED   Klebsiella oxytoca NOT DETECTED NOT DETECTED   Klebsiella pneumoniae NOT DETECTED NOT DETECTED   Proteus species NOT DETECTED NOT DETECTED   Serratia marcescens NOT DETECTED NOT DETECTED   Haemophilus influenzae NOT DETECTED NOT DETECTED   Neisseria meningitidis NOT DETECTED NOT DETECTED   Pseudomonas aeruginosa NOT DETECTED NOT DETECTED   Candida albicans NOT DETECTED NOT DETECTED   Candida glabrata NOT DETECTED NOT DETECTED   Candida krusei NOT DETECTED NOT DETECTED   Candida parapsilosis NOT DETECTED NOT DETECTED   Candida tropicalis NOT DETECTED NOT DETECTED    Hannah Jackson 02/21/2018  2:07 AM

## 2018-02-21 NOTE — Progress Notes (Signed)
Patient ID: ETHELEEN VALTIERRA, female   DOB: 04-17-1928, 84 y.o.   MRN: [id number]  [employer] PROGRESS NOTE  ARNETT GALINDEZ WUJ:[id number] DOB: 1928/07/10 DOA: 02/19/2018 PCP: System, Pcp Not In  HPI/Subjective: Patient feeling better.  Breathing a little bit better.  Some cough.  Had another episode of hypoglycemia this morning.  She states that sometimes food gets stuck in her chest and then she stops eating.  Objective: Vitals:   02/21/18 0543 02/21/18 0753  BP: (!) 166/83 (!) 159/65  Pulse: 74 72  Resp: 18 18  Temp: 97.8 F (36.6 C) 97.7 F (36.5 C)  SpO2: 95% 100%    Filed Weights   02/19/18 2223 02/20/18 0300 02/21/18 0543  Weight: 90.7 kg (200 lb) 90 kg (198 lb 6.6 oz) 90.3 kg (199 lb)    ROS: Review of Systems  Unable to perform ROS: Acuity of condition  Constitutional: Negative for chills and fever.  Eyes: Negative for blurred vision.  Respiratory: Positive for cough and shortness of breath.   Cardiovascular: Negative for chest pain.  Gastrointestinal: Negative for abdominal pain, constipation, diarrhea, nausea and vomiting.  Genitourinary: Negative for dysuria.  Musculoskeletal: Negative for joint pain.  Neurological: Negative for dizziness and headaches.   Exam: Physical Exam  HENT:  Nose: No mucosal edema.  Mouth/Throat: No oropharyngeal exudate or posterior oropharyngeal edema.  Eyes: Pupils are equal, round, and reactive to light. Conjunctivae, EOM and lids are normal.  Neck: No JVD present. Carotid bruit is not present. No edema present. No thyroid mass and no thyromegaly present.  Cardiovascular: S1 normal and S2 normal. Exam reveals no gallop.  No murmur heard. Pulses:      Dorsalis pedis pulses are 2+ on the right side, and 2+ on the left side.  Respiratory: No respiratory distress. She has decreased breath sounds in the right lower field and the left lower field. She has no wheezes. She has rhonchi in the right lower field and the left lower field.  She has no rales.  GI: Soft. Bowel sounds are normal. There is no tenderness.  Musculoskeletal:       Right ankle: She exhibits swelling.       Left ankle: She exhibits swelling.  Lymphadenopathy:    She has no cervical adenopathy.  Neurological: She is alert. No cranial nerve deficit.  Skin: Skin is warm. No rash noted. Nails show no clubbing.  Psychiatric: She has a normal mood and affect.      Data Reviewed: Basic Metabolic Panel: Recent Labs  Lab 02/19/18 2226 02/20/18 0708 02/21/18 0458  NA 142 142 141  K 3.6 4.4 3.9  CL 107 108 106  CO2 27 28 29   GLUCOSE 191* 189* 148*  BUN 25* 24* 20  CREATININE 0.91 0.73 0.80  CALCIUM 8.9 8.5* 8.5*   Liver Function Tests: Recent Labs  Lab 02/19/18 2226  AST 19  ALT <5*  ALKPHOS 59  BILITOT 0.5  PROT 7.5  ALBUMIN 4.4   CBC: Recent Labs  Lab 02/20/18 0708  WBC 10.1  HGB 12.2  HCT 38.0  MCV 86.4  PLT 198    BNP (last 3 results) Recent Labs    02/19/18 2254  BNP 165.0*    CBG: Recent Labs  Lab 02/21/18 0619 02/21/18 0749 02/21/18 1126 02/21/18 1147 02/21/18 1208  GLUCAP 94 111* 55* 68 80    Recent Results (from the past 240 hour(s))  Urine Culture     Status: None   Collection Time:  02/19/18 10:26 PM  Result Value Ref Range Status   Specimen Description   Final    URINE, RANDOM Performed at [facility], [address], [place], [place] [address]    Special Requests   Final    Normal Performed at [facility], [address] [place], [place] [address]    Culture   Final    NO GROWTH Performed at [facility], [address], [place], [place] [address]    Report Status 02/21/2018 FINAL  Final  Blood culture (routine x 2)     Status: None (Preliminary result)   Collection Time: 02/19/18 10:27 PM  Result Value Ref Range Status   Specimen Description BLOOD RIGHT HAND  Final   Special Requests   Final    BOTTLES DRAWN AEROBIC AND ANAEROBIC Blood Culture adequate  volume   Culture   Final    NO GROWTH 2 DAYS Performed at [facility], [address] [place], [place] [address]    Report Status PENDING  Incomplete  Blood culture (routine x 2)     Status: None (Preliminary result)   Collection Time: 02/19/18 10:27 PM  Result Value Ref Range Status   Specimen Description   Final    BLOOD LEFT HAND Performed at [facility], [address] [place], [place] [address]    Special Requests   Final    BOTTLES DRAWN AEROBIC AND ANAEROBIC Blood Culture results may not be optimal due to an excessive volume of blood received in culture bottles Performed at [facility], [address], [place], [place] [address]    Culture  Setup Time   Final    GRAM POSITIVE COCCI IN BOTH AEROBIC AND ANAEROBIC BOTTLES CRITICAL RESULT CALLED TO, READ BACK BY AND VERIFIED WITH: MATT MCBANE  ON 02/21/18 AT 0200 JAG Performed at [facility], [address], [place], [place] [address]    Culture GRAM POSITIVE COCCI  Final   Report Status PENDING  Incomplete  Blood Culture ID Panel (Reflexed)     Status: Abnormal   Collection Time: 02/19/18 10:27 PM  Result Value Ref Range Status   Enterococcus species NOT DETECTED NOT DETECTED Final   Listeria monocytogenes NOT DETECTED NOT DETECTED Final   Staphylococcus species DETECTED (A) NOT DETECTED Final    Comment: Methicillin (oxacillin) susceptible coagulase negative staphylococcus. Possible blood culture contaminant (unless isolated from more than one blood culture draw or clinical case suggests pathogenicity). No antibiotic treatment is indicated for blood  culture contaminants. CRITICAL RESULT CALLED TO, READ BACK BY AND VERIFIED WITH: MATT MCBANE ON 02/21/18 AT 0200 JAG    Staphylococcus aureus NOT DETECTED NOT DETECTED Final   Methicillin resistance NOT DETECTED NOT DETECTED Final   Streptococcus species NOT DETECTED NOT DETECTED Final   Streptococcus agalactiae NOT DETECTED NOT  DETECTED Final   Streptococcus pneumoniae NOT DETECTED NOT DETECTED Final   Streptococcus pyogenes NOT DETECTED NOT DETECTED Final   Acinetobacter baumannii NOT DETECTED NOT DETECTED Final   Enterobacteriaceae species NOT DETECTED NOT DETECTED Final   Enterobacter cloacae complex NOT DETECTED NOT DETECTED Final   Escherichia coli NOT DETECTED NOT DETECTED Final   Klebsiella oxytoca NOT DETECTED NOT DETECTED Final   Klebsiella pneumoniae NOT DETECTED NOT DETECTED Final   Proteus species NOT DETECTED NOT DETECTED Final   Serratia marcescens NOT DETECTED NOT DETECTED Final   Haemophilus influenzae NOT DETECTED NOT DETECTED Final   Neisseria meningitidis NOT DETECTED NOT DETECTED  Final   Pseudomonas aeruginosa NOT DETECTED NOT DETECTED Final   Candida albicans NOT DETECTED NOT DETECTED Final   Candida glabrata NOT DETECTED NOT DETECTED Final   Candida krusei NOT DETECTED NOT DETECTED Final   Candida parapsilosis NOT DETECTED NOT DETECTED Final   Candida tropicalis NOT DETECTED NOT DETECTED Final    Comment: Performed at [facility], [address] [place], [place] [address]  MRSA PCR Screening     Status: None   Collection Time: 02/20/18  3:47 AM  Result Value Ref Range Status   MRSA by PCR NEGATIVE NEGATIVE Final    Comment:        The GeneXpert MRSA Assay (FDA approved for NASAL specimens only), is one component of a comprehensive MRSA colonization surveillance program. It is not intended to diagnose MRSA infection nor to guide or monitor treatment for MRSA infections. Performed at [facility], [address] [place], [place] [address]      Studies: Dg Chest Portable 1 View  Result Date: 02/19/2018 CLINICAL DATA:  Respiratory distress. Cough, lethargy, desaturation. History of asthma and COPD. EXAM: PORTABLE CHEST 1 VIEW COMPARISON:  02/06/2016 FINDINGS: Shallow inspiration with linear atelectasis in the lung bases. Heart size and pulmonary vascularity  are normal for technique. No airspace disease or consolidation in the lungs. No blunting of costophrenic angles. No pneumothorax. IMPRESSION: Shallow inspiration.  No evidence of active pulmonary disease. Electronically Signed   By: Burman NievesWilliam  Stevens M.D.   On: 02/19/2018 23:32    Scheduled Meds: . aspirin EC  81 mg Oral Daily  . budesonide (PULMICORT) nebulizer solution  0.25 mg Nebulization BID  . carbidopa-levodopa  1 tablet Oral 2 times per day  . carbidopa-levodopa  1 tablet Oral TID  . enoxaparin (LOVENOX) injection  40 mg Subcutaneous Q24H  . famotidine  20 mg Oral BID  . ipratropium-albuterol  3 mL Nebulization Q6H  . lisinopril  5 mg Oral Daily  . methylPREDNISolone (SOLU-MEDROL) injection  40 mg Intravenous Q12H  . nortriptyline  50 mg Oral QPM  . pregabalin  75 mg Oral BID   Continuous Infusions: . dextrose 30 mL/hr at 02/21/18 [id number]    Assessment/Plan:  1. Acute hypoxic respiratory failure on BiPAP yesterday.  Now breathing comfortably on room air. 2. Asthma exacerbation on Solu-Medrol and nebulizer treatments 3. Acute cystitis with hematuria.  Urine culture negative so discontinue antibiotics.  Blood culture is a skin contaminant. 4. Hypoglycemia with diabetes despite being on steroids.  On dextrose drip.  Patient had another hypoglycemic episode this morning.  Continue to monitor closely.  Although sugars on the chemistry are higher than what appears on the fingersticks.  We will time tomorrow's chemistry at the same time that they do the fingerstick. 5. Essential hypertension on lisinopril 6. Parkinson's disease on Sinemet long-acting and short acting.  Spoke with pharmacist to resume 7. Weakness.  Physical therapy evaluation 8. Dysphasia.  May need endoscopy as outpatient.  Respiratory status and sugar will have to be figured out prior to disposition.  Code Status:     Code Status Orders  (From admission, onward)        Start     Ordered   02/20/18 0337  Full  code  Continuous     02/20/18 0336    Code Status History    This patient has a current code status but no historical code status.     Family Communication:  spoke with the 2 sisters at the bedside  Disposition Plan: Would like to see the patient move better air prior to disposition  Time spent: 27 minutes  Daxton Nydam [employer]

## 2018-02-21 NOTE — Progress Notes (Signed)
After initial hypoglycemic event, an additional 4oz of milk was given to patient and food was ordered for her.  Dr. Renae GlossWieting aware and said to continue d10

## 2018-02-21 NOTE — Progress Notes (Addendum)
Hypoglycemic Event  CBG: 55  Treatment: 4oz of OJ  Symptoms: none  Follow-up CBG: Time:1145 CBG Result:68  Possible Reasons for Event: patient did not eat any of her breakfast       Hannah Jackson [name]

## 2018-02-21 NOTE — Evaluation (Signed)
Physical Therapy Evaluation Patient Details Name: Hannah Jackson MRN: [id number] DOB: [birth year] Today's Date: 02/21/2018   History of Present Illness   84 y.o. female who presents with several days of progressive respiratory symptoms: coughing, shortness of breath, admitted with asthma exacerbation.   Clinical Impression  Per pt report she lives alone, however it appears as though recently she has likely had nearly 24/7 assist.  She was very functionally limited today needing very heavy assist with bed mobility, needed multiple max assist attempts to get to standing and struggling to take even 2 very small side shuffle steps along EOB (leaning legs back on bed t/o the effort separate from exam).  Pt states she does some walking weekly at the [facility] program but clearly she is too weak to do any walking at this point.  Pt very limited, will need 24/7 assist and some form of rehab once d/c'd from [facility].    Follow Up Recommendations SNF([facility] program participant), 24/7 supervision    Equipment Recommendations  None recommended by PT    Recommendations for Other Services       Precautions / Restrictions Precautions Precautions: Fall Restrictions Weight Bearing Restrictions: No      Mobility  Bed Mobility Overal bed mobility: Needs Assistance Bed Mobility: Supine to Sit;Sit to Supine     Supine to sit: Max assist Sit to supine: Total assist   General bed mobility comments: Pt reports that she always needs assist to get in/out of bed.  Needed heavy assist today with all mobility  Transfers Overall transfer level: Needs assistance Equipment used: Rolling walker (2 wheeled) Transfers: Sit to/from Stand Sit to Stand: Max assist         General transfer comment: Pt unable to really get weight forward over walker and much of the time she was leaning back of legs on bed and unable to really stand. With a lot of cuing she was able to get weight forward enough to try a few small and labored  side steps along EOB but ultimately she was very limited/unsafe.  Ambulation/Gait             General Gait Details: unable to ambulate  Stairs            Wheelchair Mobility    Modified Rankin (Stroke Patients Only)       Balance Overall balance assessment: Needs assistance Sitting-balance support: Bilateral upper extremity supported Sitting balance-Leahy Scale: Good     Standing balance support: Bilateral upper extremity supported Standing balance-Leahy Scale: Zero                               Pertinent Vitals/Pain Pain Assessment: No/denies pain    Home Living Family/patient expects to be discharged to:: Private residence Living Arrangements: Alone(family has been staying overnight recently) Available Help at Discharge: Family;Personal care attendant(has aides M-F 2 hrs AM&PM, [facility] MWF)   Home Access: Ramped entrance       Home Equipment: Walker - 4 wheels;Wheelchair - manual Additional Comments: Pt reports that she walks with rollator in the home, uses w/c out    Prior Function Level of Independence: Needs assistance   Gait / Transfers Assistance Needed: she's "not allowed" to get up w/o someone being with her     Comments: It appears pt has progressively gotten weaker recently     Hand Dominance        Extremity/Trunk Assessment   Upper Extremity  Assessment Upper Extremity Assessment: Generalized weakness(age appropriate limitations)    Lower Extremity Assessment Lower Extremity Assessment: Generalized weakness(age appropriate limitations)       Communication   Communication: (slow to respond, forgets what she was going to say t/o tx)  Cognition Arousal/Alertness: Lethargic;Awake/alert Behavior During Therapy: WFL for tasks assessed/performed Overall Cognitive Status: Difficult to assess                                 General Comments: Pt with some baseline dementia, but able to answer most questions  appropriately      General Comments      Exercises     Assessment/Plan    PT Assessment Patient needs continued PT services  PT Problem List Decreased strength;Decreased range of motion;Decreased activity tolerance;Decreased balance;Decreased mobility;Decreased coordination;Decreased cognition;Decreased knowledge of use of DME;Decreased safety awareness;Cardiopulmonary status limiting activity       PT Treatment Interventions DME instruction;Gait training;Stair training;Functional mobility training;Therapeutic activities;Therapeutic exercise;Balance training;Neuromuscular re-education;Cognitive remediation;Patient/family education    PT Goals (Current goals can be found in the Care Plan section)  Acute Rehab PT Goals Patient Stated Goal: get stronger PT Goal Formulation: With patient Time For Goal Achievement: 03/07/18 Potential to Achieve Goals: Fair    Frequency Min 2X/week   Barriers to discharge        Co-evaluation               AM-PAC PT "6 Clicks" Daily Activity  Outcome Measure Difficulty turning over in bed (including adjusting bedclothes, sheets and blankets)?: Unable Difficulty moving from lying on back to sitting on the side of the bed? : Unable Difficulty sitting down on and standing up from a chair with arms (e.g., wheelchair, bedside commode, etc,.)?: Unable Help needed moving to and from a bed to chair (including a wheelchair)?: Total Help needed walking in hospital room?: Total Help needed climbing 3-5 steps with a railing? : Total 6 Click Score: 6    End of Session Equipment Utilized During Treatment: Gait belt Activity Tolerance: Patient limited by fatigue Patient left: with bed alarm set;with call bell/phone within reach Nurse Communication: Mobility status PT Visit Diagnosis: Muscle weakness (generalized) (M62.81);Difficulty in walking, not elsewhere classified (R26.2)    Time: [id number] PT Time Calculation (min) (ACUTE ONLY): 34  min   Charges:   PT Evaluation $PT Eval Low Complexity: 1 Low PT Treatments $Therapeutic Activity: 8-22 mins   PT G Codes:        Malachi ProGalen R Maks Cavallero, DPT 02/21/2018, 3:47 PM

## 2018-02-22 LAB — GLUCOSE, CAPILLARY
GLUCOSE-CAPILLARY: 192 mg/dL — AB (ref 65–99)
GLUCOSE-CAPILLARY: 231 mg/dL — AB (ref 65–99)
GLUCOSE-CAPILLARY: 252 mg/dL — AB (ref 65–99)
GLUCOSE-CAPILLARY: 278 mg/dL — AB (ref 65–99)
Glucose-Capillary: 119 mg/dL — ABNORMAL HIGH (ref 65–99)

## 2018-02-22 MED ORDER — BOOST PLUS PO LIQD
237.0000 mL | Freq: Three times a day (TID) | ORAL | Status: DC
  Administered 2018-02-22 – 2018-02-24 (×6): 237 mL via ORAL

## 2018-02-22 MED ORDER — IPRATROPIUM-ALBUTEROL 0.5-2.5 (3) MG/3ML IN SOLN
3.0000 mL | Freq: Three times a day (TID) | RESPIRATORY_TRACT | Status: DC
  Administered 2018-02-23 – 2018-02-24 (×4): 3 mL via RESPIRATORY_TRACT
  Filled 2018-02-22 (×5): qty 3

## 2018-02-22 NOTE — Progress Notes (Signed)
Patient ID: Hannah Jackson, female   DOB: 12/24/1927, 84 y.o.   MRN: [id number]  [employer] PROGRESS NOTE  Hannah HighlandKatie M Entwistle WUJ:[id number] DOB: 12/24/1927 DOA: 02/19/2018 PCP: System, Pcp Not In  HPI/Subjective: Patient feeling better blood glucose improved Continues to complain of dysphagia  Objective: Vitals:   02/22/18 0836 02/22/18 1033  BP: (!) 180/66 (!) 113/27  Pulse: 75 79  Resp: 18   Temp: 97.8 F (36.6 C)   SpO2: 100%     Filed Weights   02/20/18 0300 02/21/18 0543 02/22/18 0500  Weight: 198 lb 6.6 oz (90 kg) 199 lb (90.3 kg) 199 lb 11.8 oz (90.6 kg)    ROS: Review of Systems  Unable to perform ROS: Acuity of condition  Constitutional: Negative for chills and fever.  Eyes: Negative for blurred vision.  Respiratory: Positive for cough and shortness of breath.   Cardiovascular: Negative for chest pain.  Gastrointestinal: Negative for abdominal pain, constipation, diarrhea, nausea and vomiting.  Genitourinary: Negative for dysuria.  Musculoskeletal: Negative for joint pain.  Neurological: Negative for dizziness and headaches.   Exam: Physical Exam  HENT:  Nose: No mucosal edema.  Mouth/Throat: No oropharyngeal exudate or posterior oropharyngeal edema.  Eyes: Pupils are equal, round, and reactive to light. Conjunctivae, EOM and lids are normal.  Neck: No JVD present. Carotid bruit is not present. No edema present. No thyroid mass and no thyromegaly present.  Cardiovascular: S1 normal and S2 normal. Exam reveals no gallop.  No murmur heard. Pulses:      Dorsalis pedis pulses are 2+ on the right side, and 2+ on the left side.  Respiratory: No respiratory distress. She has decreased breath sounds in the right lower field and the left lower field. She has no wheezes. She has rhonchi in the right lower field and the left lower field. She has no rales.  GI: Soft. Bowel sounds are normal. There is no tenderness.  Musculoskeletal:       Right ankle: She exhibits  swelling.       Left ankle: She exhibits swelling.  Lymphadenopathy:    She has no cervical adenopathy.  Neurological: She is alert. No cranial nerve deficit.  Skin: Skin is warm. No rash noted. Nails show no clubbing.  Psychiatric: She has a normal mood and affect.      Data Reviewed: Basic Metabolic Panel: Recent Labs  Lab 02/19/18 2226 02/20/18 0708 02/21/18 0458  NA 142 142 141  K 3.6 4.4 3.9  CL 107 108 106  CO2 27 28 29   GLUCOSE 191* 189* 148*  BUN 25* 24* 20  CREATININE 0.91 0.73 0.80  CALCIUM 8.9 8.5* 8.5*   Liver Function Tests: Recent Labs  Lab 02/19/18 2226  AST 19  ALT <5*  ALKPHOS 59  BILITOT 0.5  PROT 7.5  ALBUMIN 4.4   CBC: Recent Labs  Lab 02/20/18 0708  WBC 10.1  HGB 12.2  HCT 38.0  MCV 86.4  PLT 198    BNP (last 3 results) Recent Labs    02/19/18 2254  BNP 165.0*    CBG: Recent Labs  Lab 02/21/18 1656 02/21/18 2206 02/22/18 0008 02/22/18 0747 02/22/18 1148  GLUCAP 68 253* 252* 119* 192*    Recent Results (from the past 240 hour(s))  Urine Culture     Status: None   Collection Time: 02/19/18 10:26 PM  Result Value Ref Range Status   Specimen Description   Final    URINE, RANDOM Performed at [facility],  [address], [place], [place] [address]    Special Requests   Final    Normal Performed at [facility], [address] [place], [place] [address]    Culture   Final    NO GROWTH Performed at [facility], [address], [place], [place] [address]    Report Status 02/21/2018 FINAL  Final  Blood culture (routine x 2)     Status: None (Preliminary result)   Collection Time: 02/19/18 10:27 PM  Result Value Ref Range Status   Specimen Description BLOOD RIGHT HAND  Final   Special Requests   Final    BOTTLES DRAWN AEROBIC AND ANAEROBIC Blood Culture adequate volume   Culture   Final    NO GROWTH 3 DAYS Performed at [facility], [address] [place], [place] [address]     Report Status PENDING  Incomplete  Blood culture (routine x 2)     Status: Abnormal (Preliminary result)   Collection Time: 02/19/18 10:27 PM  Result Value Ref Range Status   Specimen Description   Final    BLOOD LEFT HAND Performed at [facility], [address], [place], [place] [address]    Special Requests   Final    BOTTLES DRAWN AEROBIC AND ANAEROBIC Blood Culture results may not be optimal due to an excessive volume of blood received in culture bottles Performed at [facility], [address] [place], [place] [address]    Culture  Setup Time   Final    GRAM POSITIVE COCCI IN BOTH AEROBIC AND ANAEROBIC BOTTLES CRITICAL RESULT CALLED TO, READ BACK BY AND VERIFIED WITH: MATT MCBANE  ON 02/21/18 AT 0200 JAG    Culture (A)  Final    STAPHYLOCOCCUS SPECIES (COAGULASE NEGATIVE) THE SIGNIFICANCE OF ISOLATING THIS ORGANISM FROM A SINGLE SET OF BLOOD CULTURES WHEN MULTIPLE SETS ARE DRAWN IS UNCERTAIN. PLEASE NOTIFY THE MICROBIOLOGY DEPARTMENT WITHIN ONE WEEK IF SPECIATION AND SENSITIVITIES ARE REQUIRED. Performed at [facility], [address] [place], [place] [address]    Report Status PENDING  Incomplete  Blood Culture ID Panel (Reflexed)     Status: Abnormal   Collection Time: 02/19/18 10:27 PM  Result Value Ref Range Status   Enterococcus species NOT DETECTED NOT DETECTED Final   Listeria monocytogenes NOT DETECTED NOT DETECTED Final   Staphylococcus species DETECTED (A) NOT DETECTED Final    Comment: Methicillin (oxacillin) susceptible coagulase negative staphylococcus. Possible blood culture contaminant (unless isolated from more than one blood culture draw or clinical case suggests pathogenicity). No antibiotic treatment is indicated for blood  culture contaminants. CRITICAL RESULT CALLED TO, READ BACK BY AND VERIFIED WITH: MATT MCBANE ON 02/21/18 AT 0200 JAG    Staphylococcus aureus NOT DETECTED NOT DETECTED Final   Methicillin resistance NOT  DETECTED NOT DETECTED Final   Streptococcus species NOT DETECTED NOT DETECTED Final   Streptococcus agalactiae NOT DETECTED NOT DETECTED Final   Streptococcus pneumoniae NOT DETECTED NOT DETECTED Final   Streptococcus pyogenes NOT DETECTED NOT DETECTED Final   Acinetobacter baumannii NOT DETECTED NOT DETECTED Final   Enterobacteriaceae species NOT DETECTED NOT DETECTED Final   Enterobacter cloacae complex NOT DETECTED NOT DETECTED Final   Escherichia coli NOT DETECTED NOT DETECTED Final   Klebsiella oxytoca NOT DETECTED NOT DETECTED Final   Klebsiella pneumoniae NOT DETECTED NOT DETECTED Final   Proteus species NOT DETECTED NOT DETECTED Final   Serratia marcescens NOT DETECTED NOT DETECTED Final   Haemophilus  influenzae NOT DETECTED NOT DETECTED Final   Neisseria meningitidis NOT DETECTED NOT DETECTED Final   Pseudomonas aeruginosa NOT DETECTED NOT DETECTED Final   Candida albicans NOT DETECTED NOT DETECTED Final   Candida glabrata NOT DETECTED NOT DETECTED Final   Candida krusei NOT DETECTED NOT DETECTED Final   Candida parapsilosis NOT DETECTED NOT DETECTED Final   Candida tropicalis NOT DETECTED NOT DETECTED Final    Comment: Performed at [facility], [address] [place], [place] [address]  MRSA PCR Screening     Status: None   Collection Time: 02/20/18  3:47 AM  Result Value Ref Range Status   MRSA by PCR NEGATIVE NEGATIVE Final    Comment:        The GeneXpert MRSA Assay (FDA approved for NASAL specimens only), is one component of a comprehensive MRSA colonization surveillance program. It is not intended to diagnose MRSA infection nor to guide or monitor treatment for MRSA infections. Performed at [facility], [address] [place], [place] [address]      Studies: No results found.  Scheduled Meds: . aspirin EC  81 mg Oral Daily  . budesonide (PULMICORT) nebulizer solution  0.25 mg Nebulization BID  . carbidopa-levodopa  1 tablet Oral 2  times per day  . carbidopa-levodopa  1 tablet Oral TID  . enoxaparin (LOVENOX) injection  40 mg Subcutaneous Q24H  . famotidine  20 mg Oral BID  . ipratropium-albuterol  3 mL Nebulization Q6H  . lactose free nutrition  237 mL Oral TID WC  . lisinopril  5 mg Oral Daily  . methylPREDNISolone (SOLU-MEDROL) injection  40 mg Intravenous Q12H  . nortriptyline  50 mg Oral QPM  . pregabalin  75 mg Oral BID   Continuous Infusions:   Assessment/Plan:  1. Acute hypoxic respiratory failure currently on room air much improved 2. Asthma exacerbation continue Solu-Medrol and nebulizer treatments 3. Acute cystitis with hematuria.  Urine culture negative antibiotics have been discussed blood culture is a skin contaminant. 4. Hypoglycemia with diabetes despite being on steroids.  Discontinue dextrose monitor blood sugar 5. Essential hypertension on lisinopril 6. Parkinson's disease on Sinemet long-acting and short acting.  Spoke with pharmacist to resume 7. Weakness.  Physical therapy evaluation 8. Dysphasia.  GI consult for dysphasia may need EGD.  Code Status:     Code Status Orders  (From admission, onward)        Start     Ordered   02/20/18 0337  Full code  Continuous     02/20/18 0336    Code Status History    This patient has a current code status but no historical code status.     Family Communication:  spoke with the 2 sisters at the bedside Disposition Plan: Would like to see the patient move better air prior to disposition  Time spent: 27 minutes  [employer]

## 2018-02-22 NOTE — Consult Note (Addendum)
[facility] GI Inpatient Consult Note   Hannah Jackson, M.D.  Reason for Consult: Dysphagia.   Attending Requesting Consult: Auburn Bilberry, MD  History of Present Illness: Hannah Jackson is a 84 y.o. female with a history of Parkinson's disease, mild dementia and moderate to severe asthma admitted to the hospital 3 days ago for asthma exacerbation with respiratory distress.  Patient has required BiPAP oxygen support and appears to responded well to IV antibiotics.  Patient is no longer requiring BiPAP although she continues to be somnolent.  Her sister, Hannah Jackson, is at the bedside.  Patient is resting comfortably.  The patient's sister says the patient has a good appetite and tends to be overweight.  There has been no pre-existing issues with dysphagia according to the sister and no previous known episodes of aspiration.  Patient reportedly had significant respiratory issues after presumptive aspiration event at home.  That has resolved at this time.  Patient is getting treated for an asthma exacerbation and is doing well. The patient's sister, Hannah Jackson, mentions the patient has difficulty feeding herself due to tremor from Parkinson's disease. There has been reported witnessed complaint of the patient grabbing her throat and mid chest area and sometimes during the swallowing activity.  This has led to the patient's sister believe that there is some hang up of food somewhere between the neck and the chest.  Past Medical History:  Past Medical History:  Diagnosis Date  . Asthma   . Diabetes mellitus without complication (HCC)    on metformin  . HLD (hyperlipidemia)   . HTN (hypertension)   . Parkinson's disease [facility])     Problem List: Patient Active Problem List   Diagnosis Date Noted  . UTI (urinary tract infection) 02/20/2018  . COPD (chronic obstructive pulmonary disease) (HCC)   . Asthma exacerbation 02/19/2018  . HTN (hypertension) 02/19/2018  . HLD (hyperlipidemia)  02/19/2018  . Diabetes (HCC) 02/19/2018  . Parkinson's disease (HCC) 02/19/2018    Past Surgical History: Past Surgical History:  Procedure Laterality Date  . BACK SURGERY      Allergies: Allergies  Allergen Reactions  . Penicillins     Home Medications: Medications Prior to Admission  Medication Sig Dispense Refill Last Dose  . acetaminophen (TYLENOL) 650 MG CR tablet Take 650 mg by mouth 2 (two) times daily. For arthritis pain/stiffness   Unknown at Unknown  . albuterol (PROVENTIL) (2.5 MG/3ML) 0.083% nebulizer solution Take 2.5 mg by nebulization every 6 (six) hours as needed for wheezing or shortness of breath.   prn at prn  . aspirin EC 81 MG tablet Take 81 mg by mouth daily.   Unknown at Unknown  . calcium carbonate (TUMS - DOSED IN MG ELEMENTAL CALCIUM) 500 MG chewable tablet Chew 1-2 tablets by mouth 3 (three) times daily as needed for indigestion or heartburn.   prn at prn  . carbidopa-levodopa (SINEMET CR) 50-200 MG tablet Take 1 tablet by mouth 2 (two) times daily. In the morning and at noon   Unknown at Unknown  . carbidopa-levodopa (SINEMET IR) 25-100 MG tablet Take 1 tablet by mouth 3 (three) times daily.   Unknown at Unknown  . carboxymethylcellul-glycerin (OPTIVE) 0.5-0.9 % ophthalmic solution Place 1-2 drops into both eyes 4 (four) times daily as needed for dry eyes.   prn at prn  . cholecalciferol (VITAMIN D) 1000 units tablet Take 1,000 Units by mouth daily.   Unknown at Unknown  . diclofenac sodium (VOLTAREN) 1 % GEL Apply 2  g topically 3 (three) times daily as needed (for wrist pain).   prn at prn  . docusate sodium (COLACE) 100 MG capsule Take 100 mg by mouth daily as needed for mild constipation.   prn at prn  . guaiFENesin (MUCINEX) 600 MG 12 hr tablet Take 600 mg by mouth 2 (two) times daily as needed.   Unknown at Unknown  . ibandronate (BONIVA) 150 MG tablet Take 150 mg by mouth every 30 (thirty) days. Take in the morning with a full glass of water, on an empty  stomach, and do not take anything else by mouth or lie down for the next 60 min.   Unknown at Unknown  . lidocaine (LIDODERM) 5 % Place 1 patch onto the skin daily. Remove & Discard patch within 12 hours   Unknown at Unknown  . lisinopril (PRINIVIL,ZESTRIL) 5 MG tablet Take 5 mg by mouth daily.   Unknown at Unknown  . mirabegron ER (MYRBETRIQ) 25 MG TB24 tablet Take 25 mg by mouth daily.   Unknown at Unknown  . nortriptyline (PAMELOR) 50 MG capsule Take 50 mg by mouth every evening.   Unknown at Unknown  . oxymetazoline (AFRIN) 0.05 % nasal spray Place 2 sprays into both nostrils as needed for congestion.   Unknown at Unknown  . polyethylene glycol (MIRALAX / GLYCOLAX) packet Take 17 g by mouth daily as needed.   prn at prn  . predniSONE (DELTASONE) 20 MG tablet Take 60 mg by mouth daily. For 5 days for breathing   Unknown at Unknown  . pregabalin (LYRICA) 75 MG capsule Take 75 mg by mouth 2 (two) times daily.   Unknown at Unknown  . ranitidine (ZANTAC) 150 MG tablet Take 150 mg by mouth 2 (two) times daily.   Unknown at Unknown  . senna-docusate (SENOKOT-S) 8.6-50 MG tablet Take 1 tablet by mouth daily.   Unknown at Unknown  . sodium chloride (AYR) 0.65 % nasal spray Place 2 sprays into the nose. 2 sprays in each nostril 3-4 times daily   Unknown at Unknown  . Zinc Oxide 13 % CREA Apply 1 application topically as needed for irritation (apply cream to buttocks three times daily and as needed as a barrier cream).   prn at prn   Home medication reconciliation was completed with the patient.   Scheduled Inpatient Medications:   . aspirin EC  81 mg Oral Daily  . budesonide (PULMICORT) nebulizer solution  0.25 mg Nebulization BID  . carbidopa-levodopa  1 tablet Oral 2 times per day  . carbidopa-levodopa  1 tablet Oral TID  . enoxaparin (LOVENOX) injection  40 mg Subcutaneous Q24H  . famotidine  20 mg Oral BID  . ipratropium-albuterol  3 mL Nebulization Q6H  . lactose free nutrition  237 mL Oral  TID WC  . lisinopril  5 mg Oral Daily  . methylPREDNISolone (SOLU-MEDROL) injection  40 mg Intravenous Q12H  . nortriptyline  50 mg Oral QPM  . pregabalin  75 mg Oral BID    Continuous Inpatient Infusions:    PRN Inpatient Medications:  acetaminophen **OR** acetaminophen, guaiFENesin-dextromethorphan, hydrALAZINE, ipratropium-albuterol, ondansetron **OR** ondansetron (ZOFRAN) IV  Family History: family history includes CAD in her father; Heart attack in her father; Hypertension in her father; Leukemia in her mother; Stroke in her mother.   GI Family History: Negative.  Social History:   reports that she has never smoked. She has never used smokeless tobacco. She reports that she does not drink alcohol or use drugs.  The patient denies ETOH, tobacco, or drug use.    Review of Systems: Review of Systems - History obtained from patient sister  Physical Examination: BP (!) 113/27 (BP Location: Right Arm)   Pulse 79   Temp 97.8 F (36.6 C) (Oral)   Resp 18   Ht 5\' 4"  (1.626 m)   Wt 90.6 kg (199 lb 11.8 oz)   SpO2 100%   BMI 34.28 kg/m  Physical Exam  Constitutional: She appears well-developed and well-nourished.  HENT:  Head: Normocephalic and atraumatic.  Eyes: Pupils are equal, round, and reactive to light. EOM are normal.  Neck: Neck supple. No tracheal deviation present. No thyromegaly present.  Cardiovascular: Normal rate and normal heart sounds.  Pulmonary/Chest: No respiratory distress. She has wheezes.  Abdominal: Soft. Bowel sounds are normal.  Musculoskeletal: She exhibits edema. She exhibits no tenderness or deformity.    Data: Lab Results  Component Value Date   WBC 10.1 02/20/2018   HGB 12.2 02/20/2018   HCT 38.0 02/20/2018   MCV 86.4 02/20/2018   PLT 198 02/20/2018   Recent Labs  Lab 02/20/18 0708  HGB 12.2   Lab Results  Component Value Date   NA 141 02/21/2018   K 3.9 02/21/2018   CL 106 02/21/2018   CO2 29 02/21/2018   BUN 20 02/21/2018    CREATININE 0.80 02/21/2018   Lab Results  Component Value Date   ALT <5 (L) 02/19/2018   AST 19 02/19/2018   ALKPHOS 59 02/19/2018   BILITOT 0.5 02/19/2018   No results for input(s): APTT, INR, PTT in the last 168 hours. CBC Latest Ref Rng & Units 02/20/2018 02/06/2016  WBC 3.6 - 11.0 K/uL 10.1 5.8  Hemoglobin 12.0 - 16.0 g/dL 78.212.2 10.8(L)  Hematocrit 35.0 - 47.0 % 38.0 33.7(L)  Platelets 150 - 440 K/uL 198 128(L)    STUDIES: No results found. @IMAGES @  Assessment: 1. Dysphagia - DDx includes oropharyngeal secondary to Parkinson's disease vs. Esophageal dysphagia related to possible peptic stricture, esophageal dysmotility, esophageal malignancy.  2. Hypoxic respiratory failure, resolving, secondary to asthma exacerbation.  3.  Parkinson's disease.  4. Obesity.    Recommendations:  1. Consider regular barium swallow with tablet once it is determined that pulmonary issues have resolved.   2. Follow up endoscopy would be planned based upon patient response to therapy (?Acid suppression, ST consult, etc.) .  3. Will follow along.  Thank you for the consult. Please call with questions or concerns.  Rosina Lowensteinoledo, Jaryn Rosko, "Mellody DanceKeith" MD [facility] [address] [place], [place] [address] [phone]  02/22/2018 4:08 PM

## 2018-02-22 NOTE — Plan of Care (Signed)
  Problem: Safety: Goal: Ability to remain free from injury will improve Outcome: Progressing   Problem: Skin Integrity: Goal: Risk for impaired skin integrity will decrease Outcome: Progressing   

## 2018-02-22 NOTE — Progress Notes (Signed)
Talked to Dr. Allena KatzPatel, about patient being sleepy and drowsy was hard to arouse at first, performed sternal rub and was more alert. Patient's VSS, blood sugar was 280. Patient received lyrica this morning and is scheduled for nortriptyline at 1800, order to hold medication and to check for ABG. RN will continue to monitor.

## 2018-02-23 LAB — BLOOD GAS, ARTERIAL
Acid-Base Excess: 3.7 mmol/L — ABNORMAL HIGH (ref 0.0–2.0)
Bicarbonate: 28.5 mmol/L — ABNORMAL HIGH (ref 20.0–28.0)
FIO2: 0.21
O2 SAT: 93.8 %
PCO2 ART: 43 mmHg (ref 32.0–48.0)
PH ART: 7.43 (ref 7.350–7.450)
PO2 ART: 68 mmHg — AB (ref 83.0–108.0)
Patient temperature: 37

## 2018-02-23 LAB — GLUCOSE, CAPILLARY
GLUCOSE-CAPILLARY: 241 mg/dL — AB (ref 65–99)
GLUCOSE-CAPILLARY: 338 mg/dL — AB (ref 65–99)
Glucose-Capillary: 149 mg/dL — ABNORMAL HIGH (ref 65–99)
Glucose-Capillary: 243 mg/dL — ABNORMAL HIGH (ref 65–99)

## 2018-02-23 LAB — CULTURE, BLOOD (ROUTINE X 2)

## 2018-02-23 MED ORDER — PREDNISONE 50 MG PO TABS
50.0000 mg | ORAL_TABLET | Freq: Every day | ORAL | Status: DC
  Administered 2018-02-24: 50 mg via ORAL
  Filled 2018-02-23: qty 1

## 2018-02-23 MED ORDER — DOCUSATE SODIUM 100 MG PO CAPS
200.0000 mg | ORAL_CAPSULE | Freq: Two times a day (BID) | ORAL | Status: DC
  Administered 2018-02-23 – 2018-02-24 (×3): 200 mg via ORAL
  Filled 2018-02-23 (×3): qty 2

## 2018-02-23 MED ORDER — POLYETHYLENE GLYCOL 3350 17 G PO PACK
17.0000 g | PACK | Freq: Every day | ORAL | Status: DC
  Administered 2018-02-23 – 2018-02-24 (×2): 17 g via ORAL
  Filled 2018-02-23 (×2): qty 1

## 2018-02-23 NOTE — Progress Notes (Signed)
[facility] In[facility] Progress Note  Subjective: Patient seen for follow-up of dysphagia.  Patient is awake, alert and apparently breathing well at this morning.  She is in good spirits.  She ate almost 100% of her breakfast this morning.  She currently does not have significant dysphagia.  Objective: Vital signs in last 24 hours: Temp:  [97.9 F (36.6 C)-98.6 F (37 C)] 98.2 F (36.8 C) (04/07 0735) Pulse Rate:  [69-82] 82 (04/07 1121) Resp:  [18] 18 (04/07 1121) BP: (118-170)/(50-79) 146/59 (04/07 1121) SpO2:  [94 %-100 %] 98 % (04/07 1121) Weight:  [90.7 kg (199 lb 14.4 oz)] 90.7 kg (199 lb 14.4 oz) (04/07 0412) Blood pressure (!) 146/59, pulse 82, temperature 98.2 F (36.8 C), temperature source Oral, resp. rate 18, height 5\' 4"  (1.626 m), weight 90.7 kg (199 lb 14.4 oz), SpO2 98 %.    Intake/Output from previous day: 04/06 0701 - 04/07 0700 In: 480 [P.O.:480] Out: 1500 [Urine:1500]  Intake/Output this shift: Total I/O In: 120 [P.O.:120] Out: -    General appearance: Alert, no distress Resp: Relatively clear to auscultation bilaterally Cardio: Regular rate no gallop GI: Abdomen soft, mildly distended, nontender.  Bowel sounds are positive Extremities: Trace edema in lower extremities   Lab Results: Results for orders placed or performed during the hospital encounter of 02/19/18 (from the past 24 hour(s))  Glucose, capillary     Status: Abnormal   Collection Time: 02/22/18 11:48 AM  Result Value Ref Range   Glucose-Capillary 192 (H) 65 - 99 mg/dL  Glucose, capillary     Status: Abnormal   Collection Time: 02/22/18  4:11 PM  Result Value Ref Range   Glucose-Capillary 278 (H) 65 - 99 mg/dL  Blood gas, arterial     Status: Abnormal (Preliminary result)   Collection Time: 02/22/18  4:27 PM  Result Value Ref Range   FIO2 0.21    Delivery systems PENDING    Inspiratory PAP PENDING    Expiratory PAP PENDING    pH, Arterial 7.43 7.350 - 7.450   pCO2 arterial 43 32.0 - 48.0 mmHg   pO2, Arterial 68 (L) 83.0 - 108.0 mmHg   Bicarbonate 28.5 (H) 20.0 - 28.0 mmol/L   Acid-Base Excess 3.7 (H) 0.0 - 2.0 mmol/L   O2 Saturation 93.8 %   Patient temperature 37.0    Collection site RIGHT RADIAL    Sample type ARTERIAL DRAW   Glucose, capillary     Status: Abnormal   Collection Time: 02/22/18  8:15 PM  Result Value Ref Range   Glucose-Capillary 231 (H) 65 - 99 mg/dL   Comment 1 Notify RN    Comment 2 Document in Chart   Glucose, capillary     Status: Abnormal   Collection Time: 02/23/18  7:40 AM  Result Value Ref Range   Glucose-Capillary 149 (H) 65 - 99 mg/dL  Glucose, capillary     Status: Abnormal   Collection Time: 02/23/18 11:21 AM  Result Value Ref Range   Glucose-Capillary 241 (H) 65 - 99 mg/dL     No results for input(s): WBC, HGB, HCT, PLT in the last 72 hours. BMET Recent Labs    02/21/18 0458  NA 141  K 3.9  CL 106  CO2 29  GLUCOSE 148*  BUN 20  CREATININE 0.80  CALCIUM 8.5*   LFT No results for input(s): PROT, ALBUMIN, AST, ALT, ALKPHOS, BILITOT, BILIDIR, IBILI in the last 72 hours. PT/INR No results for input(s): LABPROT, INR in the  last 72 hours. Hepatitis Panel No results for input(s): HEPBSAG, HCVAB, HEPAIGM, HEPBIGM in the last 72 hours. C-Diff No results for input(s): CDIFFTOX in the last 72 hours. No results for input(s): CDIFFPCR in the last 72 hours.   Studies/Results: No results found.  Scheduled Inpatient Medications:   . aspirin EC  81 mg Oral Daily  . budesonide (PULMICORT) nebulizer solution  0.25 mg Nebulization BID  . carbidopa-levodopa  1 tablet Oral 2 times per day  . carbidopa-levodopa  1 tablet Oral TID  . docusate sodium  200 mg Oral BID  . enoxaparin (LOVENOX) injection  40 mg Subcutaneous Q24H  . famotidine  20 mg Oral BID  . ipratropium-albuterol  3 mL Nebulization TID  . lactose free nutrition  237 mL Oral TID WC  . lisinopril  5 mg Oral Daily  . methylPREDNISolone  (SOLU-MEDROL) injection  40 mg Intravenous Q12H  . nortriptyline  50 mg Oral QPM  . polyethylene glycol  17 g Oral Daily  . pregabalin  75 mg Oral BID    Continuous Inpatient Infusions:    PRN Inpatient Medications:  acetaminophen **OR** acetaminophen, guaiFENesin-dextromethorphan, hydrALAZINE, ipratropium-albuterol, ondansetron **OR** ondansetron (ZOFRAN) IV  Miscellaneous: Not applicable  Assessment:  1.  Dysphagia-currently improved.  Must rule out esophageal cause  Plan:   1.  Barium swallow in a.m.  Further recommendations to follow. 2.  Continue diet as tolerated.   Teodoro K. Norma Fredrickson, M.D. 02/23/2018, 11:27 AM

## 2018-02-23 NOTE — [place] FL2 (Signed)
[place] MEDICAID FL2 LEVEL OF CARE SCREENING TOOL     IDENTIFICATION  Patient Name: Hannah Jackson Birthdate: 1927-12-06 Sex: female Admission Date (Current Location): 02/19/2018  [place]   Facility and Address:  [facility], [address], [place], [place] [address]      Provider Number: [id number]  Attending Physician Name and Address:  Auburn BilberryPatel, Shreyang, MD  Relative Name and Phone Number:  Kevin FentonMarilyn Smith (Sister) [phone]    Current Level of Care: Hospital Recommended Level of Care: Skilled Nursing Facility Prior Approval Number:    Date Approved/Denied:   PASRR Number: [id number]  Discharge Plan: SNF    Current Diagnoses: Patient Active Problem List   Diagnosis Date Noted  . UTI (urinary tract infection) 02/20/2018  . COPD (chronic obstructive pulmonary disease) (HCC)   . Asthma exacerbation 02/19/2018  . HTN (hypertension) 02/19/2018  . HLD (hyperlipidemia) 02/19/2018  . Diabetes (HCC) 02/19/2018  . Parkinson's disease (HCC) 02/19/2018    Orientation RESPIRATION BLADDER Height & Weight     Self, Time, Situation, Place  Normal Incontinent Weight: 199 lb 14.4 oz (90.7 kg) Height:  5\' 4"  (162.6 cm)  BEHAVIORAL SYMPTOMS/MOOD NEUROLOGICAL BOWEL NUTRITION STATUS      Continent Diet(Heart healthy/Carb modified)  AMBULATORY STATUS COMMUNICATION OF NEEDS Skin   Extensive Assist Verbally Normal                       Personal Care Assistance Level of Assistance  Bathing, Feeding, Dressing Bathing Assistance: Limited assistance Feeding assistance: Limited assistance Dressing Assistance: Limited assistance     Functional Limitations Info             SPECIAL CARE FACTORS FREQUENCY  PT (By licensed PT)     PT Frequency: Up to 5X per day, 5 days per week              Contractures Contractures Info: Not present    Additional Factors Info  Code Status, Allergies Code Status Info:  Full Allergies Info: Penicillins           Current Medications (02/23/2018):  This is the current hospital active medication list Current Facility-Administered Medications  Medication Dose Route Frequency Provider Last Rate Last Dose  . acetaminophen (TYLENOL) tablet 650 mg  650 mg Oral Q6H PRN Oralia ManisWillis, David, MD   650 mg at 02/23/18 0515   Or  . acetaminophen (TYLENOL) suppository 650 mg  650 mg Rectal Q6H PRN Oralia ManisWillis, David, MD      . aspirin EC tablet 81 mg  81 mg Oral Daily Oralia ManisWillis, David, MD   81 mg at 02/23/18 [phone]  . budesonide (PULMICORT) nebulizer solution 0.25 mg  0.25 mg Nebulization BID Eugenie NorrieBlakeney, Dana G, NP   0.25 mg at 02/23/18 0816  . carbidopa-levodopa (SINEMET CR) 50-200 MG per tablet controlled release 1 tablet  1 tablet Oral 2 times per day Hannah HighlandWieting, Richard, MD   1 tablet at 02/23/18 0514  . carbidopa-levodopa (SINEMET IR) 25-100 MG per tablet immediate release 1 tablet  1 tablet Oral TID Oralia ManisWillis, David, MD   1 tablet at 02/23/18 [phone]  . docusate sodium (COLACE) capsule 200 mg  200 mg Oral BID Auburn BilberryPatel, Shreyang, MD      . enoxaparin (LOVENOX) injection 40 mg  40 mg Subcutaneous Q24H Oralia ManisWillis, David, MD   40 mg at 02/22/18 2005  . famotidine (PEPCID) tablet 20 mg  20 mg Oral BID Oralia ManisWillis, David, MD  20 mg at 02/23/18 0922  . guaiFENesin-dextromethorphan (ROBITUSSIN DM) 100-10 MG/5ML syrup 5 mL  5 mL Oral Q4H PRN Oralia Manis, MD      . hydrALAZINE (APRESOLINE) injection 10-20 mg  10-20 mg Intravenous Q4H PRN Eugenie Norrie, NP   10 mg at 02/20/18 0846  . ipratropium-albuterol (DUONEB) 0.5-2.5 (3) MG/3ML nebulizer solution 3 mL  3 mL Nebulization Q4H PRN Oralia Manis, MD      . ipratropium-albuterol (DUONEB) 0.5-2.5 (3) MG/3ML nebulizer solution 3 mL  3 mL Nebulization TID Auburn Bilberry, MD   3 mL at 02/23/18 0816  . lactose free nutrition (BOOST PLUS) liquid 237 mL  237 mL Oral TID WC Auburn Bilberry, MD   237 mL at 02/22/18 1606  . lisinopril (PRINIVIL,ZESTRIL) tablet 5 mg  5 mg  Oral Daily Oralia Manis, MD   5 mg at 02/23/18 4540  . methylPREDNISolone sodium succinate (SOLU-MEDROL) 40 mg/mL injection 40 mg  40 mg Intravenous Q12H Eugenie Norrie, NP   40 mg at 02/23/18 0513  . nortriptyline (PAMELOR) capsule 50 mg  50 mg Oral QPM Oralia Manis, MD   50 mg at 02/21/18 1751  . ondansetron (ZOFRAN) tablet 4 mg  4 mg Oral Q6H PRN Oralia Manis, MD       Or  . ondansetron [facility]) injection 4 mg  4 mg Intravenous Q6H PRN Oralia Manis, MD      . polyethylene glycol (MIRALAX / GLYCOLAX) packet 17 g  17 g Oral Daily Auburn Bilberry, MD      . pregabalin (LYRICA) capsule 75 mg  75 mg Oral BID Oralia Manis, MD   75 mg at 02/23/18 9811     Discharge Medications: Please see discharge summary for a list of discharge medications.  Relevant Imaging Results:  Relevant Lab Results:   Additional Information SS#[id number]  Judi Cong, LCSW

## 2018-02-23 NOTE — Progress Notes (Signed)
Patient ID: Hannah Jackson, female   DOB: 11/30/1927, 84 y.o.   MRN: [id number]  [facility] PROGRESS NOTE  Hannah Jackson:[id number] DOB: 01-27-1928 DOA: 02/19/2018 PCP: System, Pcp Not In  HPI/Subjective: Patient feels better, blood sugars now elevated States that swallowing is better   Objective: Vitals:   02/23/18 0735 02/23/18 1121  BP: (!) 170/67 (!) 146/59  Pulse: 70 82  Resp: 18 18  Temp: 98.2 F (36.8 C)   SpO2: 100% 98%    Filed Weights   02/21/18 0543 02/22/18 0500 02/23/18 0412  Weight: 199 lb (90.3 kg) 199 lb 11.8 oz (90.6 kg) 199 lb 14.4 oz (90.7 kg)    ROS: Review of Systems  Constitutional: Negative for chills and fever.  Eyes: Negative for blurred vision.  Respiratory: Negative for cough and shortness of breath.   Cardiovascular: Negative for chest pain.  Gastrointestinal: Negative for abdominal pain, constipation, diarrhea, nausea and vomiting.  Genitourinary: Negative for dysuria.  Musculoskeletal: Negative for joint pain.  Neurological: Negative for dizziness and headaches.   Exam: Physical Exam  HENT:  Nose: No mucosal edema.  Mouth/Throat: No oropharyngeal exudate or posterior oropharyngeal edema.  Eyes: Pupils are equal, round, and reactive to light. Conjunctivae, EOM and lids are normal.  Neck: No JVD present. Carotid bruit is not present. No edema present. No thyroid mass and no thyromegaly present.  Cardiovascular: S1 normal and S2 normal. Exam reveals no gallop.  No murmur heard. Pulses:      Dorsalis pedis pulses are 2+ on the right side, and 2+ on the left side.  Respiratory: No respiratory distress. She has decreased breath sounds in the right lower field and the left lower field. She has no wheezes. She has rhonchi in the right lower field and the left lower field. She has no rales.  GI: Soft. Bowel sounds are normal. There is no tenderness.  Musculoskeletal:       Right ankle: She exhibits swelling.       Left ankle: She exhibits  swelling.  Lymphadenopathy:    She has no cervical adenopathy.  Neurological: She is alert. No cranial nerve deficit.  Skin: Skin is warm. No rash noted. Nails show no clubbing.  Psychiatric: She has a normal mood and affect.      Data Reviewed: Basic Metabolic Panel: Recent Labs  Lab 02/19/18 2226 02/20/18 0708 02/21/18 0458  NA 142 142 141  K 3.6 4.4 3.9  CL 107 108 106  CO2 27 28 29   GLUCOSE 191* 189* 148*  BUN 25* 24* 20  CREATININE 0.91 0.73 0.80  CALCIUM 8.9 8.5* 8.5*   Liver Function Tests: Recent Labs  Lab 02/19/18 2226  AST 19  ALT <5*  ALKPHOS 59  BILITOT 0.5  PROT 7.5  ALBUMIN 4.4   CBC: Recent Labs  Lab 02/20/18 0708  WBC 10.1  HGB 12.2  HCT 38.0  MCV 86.4  PLT 198    BNP (last 3 results) Recent Labs    02/19/18 2254  BNP 165.0*    CBG: Recent Labs  Lab 02/22/18 1148 02/22/18 1611 02/22/18 2015 02/23/18 0740 02/23/18 1121  GLUCAP 192* 278* 231* 149* 241*    Recent Results (from the past 240 hour(s))  Urine Culture     Status: None   Collection Time: 02/19/18 10:26 PM  Result Value Ref Range Status   Specimen Description   Final    URINE, RANDOM Performed at [facility], [address] [place], [place]  [id number]    Special Requests   Final    Normal Performed at [facility], [address] [place], [place] [address]    Culture   Final    NO GROWTH Performed at [facility], [address] [place], [place] [address]    Report Status 02/21/2018 FINAL  Final  Blood culture (routine x 2)     Status: None (Preliminary result)   Collection Time: 02/19/18 10:27 PM  Result Value Ref Range Status   Specimen Description BLOOD RIGHT HAND  Final   Special Requests   Final    BOTTLES DRAWN AEROBIC AND ANAEROBIC Blood Culture adequate volume   Culture   Final    NO GROWTH 4 DAYS Performed at [facility], [address] [place], [place] [address]    Report Status PENDING  Incomplete   Blood culture (routine x 2)     Status: Abnormal   Collection Time: 02/19/18 10:27 PM  Result Value Ref Range Status   Specimen Description   Final    BLOOD LEFT HAND Performed at [facility], [address] [place], [place] [address]    Special Requests   Final    BOTTLES DRAWN AEROBIC AND ANAEROBIC Blood Culture results may not be optimal due to an excessive volume of blood received in culture bottles Performed at [facility], [address] [place], [place] [address]    Culture  Setup Time   Final    GRAM POSITIVE COCCI IN BOTH AEROBIC AND ANAEROBIC BOTTLES CRITICAL RESULT CALLED TO, READ BACK BY AND VERIFIED WITH: MATT MCBANE  ON 02/21/18 AT 0200 JAG    Culture (A)  Final    STAPHYLOCOCCUS SPECIES (COAGULASE NEGATIVE) THE SIGNIFICANCE OF ISOLATING THIS ORGANISM FROM A SINGLE SET OF BLOOD CULTURES WHEN MULTIPLE SETS ARE DRAWN IS UNCERTAIN. PLEASE NOTIFY THE MICROBIOLOGY DEPARTMENT WITHIN ONE WEEK IF SPECIATION AND SENSITIVITIES ARE REQUIRED. Performed at [facility], [address] [place], [place] [address]    Report Status 02/23/2018 FINAL  Final  Blood Culture ID Panel (Reflexed)     Status: Abnormal   Collection Time: 02/19/18 10:27 PM  Result Value Ref Range Status   Enterococcus species NOT DETECTED NOT DETECTED Final   Listeria monocytogenes NOT DETECTED NOT DETECTED Final   Staphylococcus species DETECTED (A) NOT DETECTED Final    Comment: Methicillin (oxacillin) susceptible coagulase negative staphylococcus. Possible blood culture contaminant (unless isolated from more than one blood culture draw or clinical case suggests pathogenicity). No antibiotic treatment is indicated for blood  culture contaminants. CRITICAL RESULT CALLED TO, READ BACK BY AND VERIFIED WITH: MATT MCBANE ON 02/21/18 AT 0200 JAG    Staphylococcus aureus NOT DETECTED NOT DETECTED Final   Methicillin resistance NOT DETECTED NOT DETECTED Final   Streptococcus species  NOT DETECTED NOT DETECTED Final   Streptococcus agalactiae NOT DETECTED NOT DETECTED Final   Streptococcus pneumoniae NOT DETECTED NOT DETECTED Final   Streptococcus pyogenes NOT DETECTED NOT DETECTED Final   Acinetobacter baumannii NOT DETECTED NOT DETECTED Final   Enterobacteriaceae species NOT DETECTED NOT DETECTED Final   Enterobacter cloacae complex NOT DETECTED NOT DETECTED Final   Escherichia coli NOT DETECTED NOT DETECTED Final   Klebsiella oxytoca NOT DETECTED NOT DETECTED Final   Klebsiella pneumoniae NOT DETECTED NOT DETECTED Final   Proteus species NOT DETECTED NOT DETECTED Final   Serratia marcescens NOT DETECTED NOT DETECTED Final   Haemophilus influenzae NOT DETECTED NOT DETECTED Final  Neisseria meningitidis NOT DETECTED NOT DETECTED Final   Pseudomonas aeruginosa NOT DETECTED NOT DETECTED Final   Candida albicans NOT DETECTED NOT DETECTED Final   Candida glabrata NOT DETECTED NOT DETECTED Final   Candida krusei NOT DETECTED NOT DETECTED Final   Candida parapsilosis NOT DETECTED NOT DETECTED Final   Candida tropicalis NOT DETECTED NOT DETECTED Final    Comment: Performed at [facility], [address] [place], [place] [address]  MRSA PCR Screening     Status: None   Collection Time: 02/20/18  3:47 AM  Result Value Ref Range Status   MRSA by PCR NEGATIVE NEGATIVE Final    Comment:        The GeneXpert MRSA Assay (FDA approved for NASAL specimens only), is one component of a comprehensive MRSA colonization surveillance program. It is not intended to diagnose MRSA infection nor to guide or monitor treatment for MRSA infections. Performed at [facility], [address], [place], [place] [address]      Studies: No results found.  Scheduled Meds: . aspirin EC  81 mg Oral Daily  . budesonide (PULMICORT) nebulizer solution  0.25 mg Nebulization BID  . carbidopa-levodopa  1 tablet Oral 2 times per day  . carbidopa-levodopa  1 tablet Oral  TID  . docusate sodium  200 mg Oral BID  . enoxaparin (LOVENOX) injection  40 mg Subcutaneous Q24H  . famotidine  20 mg Oral BID  . ipratropium-albuterol  3 mL Nebulization TID  . lactose free nutrition  237 mL Oral TID WC  . lisinopril  5 mg Oral Daily  . methylPREDNISolone (SOLU-MEDROL) injection  40 mg Intravenous Q12H  . nortriptyline  50 mg Oral QPM  . polyethylene glycol  17 g Oral Daily  . pregabalin  75 mg Oral BID   Continuous Infusions:   Assessment/Plan:  1. Acute hypoxic respiratory failure currently on room air much improved 2. Asthma exacerbation discontinue Solu-Medrol oral prednisone 3. Acute cystitis with hematuria.  Urine culture negative antibiotics have been discussed blood culture is a skin contaminant. 4. Hypoglycemia with diabetes despite being on steroids.  Blood glucose now elevated r 5. Essential hypertension on lisinopril 6. Parkinson's disease on Sinemet long-acting and short acting.  Spoke with pharmacist to resume 7. Weakness.  Patient will need to go to rehab  8. dysphasia.  Barium study tomorrow morning  Code Status:     Code Status Orders  (From admission, onward)        Start     Ordered   02/20/18 0337  Full code  Continuous     02/20/18 0336    Code Status History    This patient has a current code status but no historical code status.     Family Communication:  spoke with the  sisters at the bedside Disposition Plan: Discharge to rehab  time spent: 27 minutes  [employer]oee [employer]

## 2018-02-24 ENCOUNTER — Encounter
Admission: RE | Admit: 2018-02-24 | Discharge: 2018-02-24 | Disposition: A | Discharge status: Home / Self Care | Payer: Medicare (Managed Care) | Source: Ambulatory Visit | Attending: Internal Medicine | Admitting: Internal Medicine

## 2018-02-24 ENCOUNTER — Inpatient Hospital Stay: Payer: Medicare (Managed Care)

## 2018-02-24 LAB — CULTURE, BLOOD (ROUTINE X 2)
Culture: NO GROWTH
SPECIAL REQUESTS: ADEQUATE

## 2018-02-24 LAB — GLUCOSE, CAPILLARY
GLUCOSE-CAPILLARY: 43 mg/dL — AB (ref 65–99)
GLUCOSE-CAPILLARY: 69 mg/dL (ref 65–99)
GLUCOSE-CAPILLARY: 78 mg/dL (ref 65–99)
Glucose-Capillary: 126 mg/dL — ABNORMAL HIGH (ref 65–99)

## 2018-02-24 MED ORDER — BUDESONIDE 0.25 MG/2ML IN SUSP: 0.2500 mg | Freq: Two times a day (BID) | RESPIRATORY_TRACT | 12 refills | Status: DC

## 2018-02-24 MED ORDER — BOOST PLUS PO LIQD: 237.0000 mL | Freq: Three times a day (TID) | ORAL | 0 refills | Status: DC

## 2018-02-24 MED ORDER — GUAIFENESIN-DM 100-10 MG/5ML PO SYRP: 5.0000 mL | ORAL_SOLUTION | ORAL | 0 refills | Status: DC | PRN

## 2018-02-24 MED ORDER — GLUCOSE 4 G PO CHEW: 1.0000 | CHEWABLE_TABLET | Freq: Two times a day (BID) | ORAL | 12 refills | Status: DC

## 2018-02-24 NOTE — Clinical Social Work Note (Signed)
[facility]                            .  Patient and family agreeable to plans will transport via ems RN to call report.  Windell MouldingEric Rital Cavey, MSW, Theresia MajorsLCSWA [phone]

## 2018-02-24 NOTE — Progress Notes (Signed)
Advanced care plan.  Purpose of the Encounter: CODE STATUS  Parties in Attendance: Patient herself and sister  Patient's Decision Capacity: Intact  Subjective/Patient's story: Patient is a 84 year old with history of asthma, COPD, Parkinson's disease and diabetes and hyperlipidemia and hypertension who is presenting to the ED with shortness of breath.   Objective/Medical story Based on her advanced age I discussed the CODE STATUS with the patient and her sister also discussed CPR intubation.  Patient currently is undecided but would want to continue this conversation with primary PCP   Goals of care determination: Full code pending further discussion with primary care provider    CODE STATUS: Full code   Time spent discussing advanced care planning: 16 minutes

## 2018-02-24 NOTE — Discharge Summary (Addendum)
[facility] - [place] at [facility], 84 y.o., DOB Apr 24, 1928, MRN [id number]. Admission date: 02/19/2018 Discharge Date 02/24/2018 Primary MD System, Pcp Not In Admitting Physician Oralia Manis, MD  Admission Diagnosis  Shortness of breath [R06.02] Cough [R05] Respiratory distress [R06.03] Hypoxia [R09.02]  Discharge Diagnosis   Principal Problem: Acute hypoxic respiratory failure Acute asthma exacerbation Since acute cystitis Dysphasia due presbyesophagus esophagus Hypoglycemia   HTN (hypertension)   HLD (hyperlipidemia)   Diabetes (HCC)   Parkinson's disease [facility])          Hospital Course  Hannah Jackson  is a 84 y.o. female who presents with several days of progressive respiratory symptoms.    Patient was noted to have acute respiratory failure due to acute asthma exacerbation.  Patient was treated for asthma with significant improvement in her symptoms.  Patient currently on room air.  She also was complaining of dysphagia therefore underwent a barium study which showed presbyesophagus esophagus.  Patient also noticed to have hypoglycemia and she is encouraged to keep eating.  If hypoglycemia persists she needs a endocrinology evaluation as outpatient.  Patient is very weak and deconditioned need of rehab.             Consults  None  Significant Tests:  See full reports for all details     Dg Esophagus  Result Date: 02/24/2018 CLINICAL DATA:  Difficulty swallowing. Dementia, Parkinson's disease. EXAM: ESOPHOGRAM/BARIUM SWALLOW TECHNIQUE: Single contrast examination was performed using thin barium or water soluble. The patient ingested the barium through a straw all in the semi recumbent position. FLUOROSCOPY TIME:  Fluoroscopy Time:  0 minutes, 48 seconds Radiation Exposure Index (if provided by the fluoroscopic device): 24.6 mGy Number of Acquired Spot Images: 3+1 video loop COMPARISON:  None in PACs FINDINGS: The patient ingested the thin  barium through a straw without difficulty. No cough was elicited. Positioning was difficult for assessing the hypopharynx and cervical esophagus but no laryngeal or tracheal or bronchial penetration of the barium was detected on subsequent images. The thoracic esophagus was tortuous. There were prominent tertiary contractions. Appropriate gauge in of the primary peristaltic wave was weak with very limited secondary stripping wave activity. There was delayed relaxation of the lower esophageal sphincter but it did ultimately relax but was noted to be narrow in caliber. IMPRESSION: Findings compatible with presbyesophagus. I cannot exclude a fairly long segment distal esophageal stricture. No evidence of aspiration. Direct visualization of the distal esophagus would be useful if the patient can tolerate the procedure. Electronically Signed   By: David  [place]az[place] M.D.   On: 02/24/2018 08:50   Dg Chest Portable 1 View  Result Date: 02/19/2018 CLINICAL DATA:  Respiratory distress. Cough, lethargy, desaturation. History of asthma and COPD. EXAM: PORTABLE CHEST 1 VIEW COMPARISON:  02/06/2016 FINDINGS: Shallow inspiration with linear atelectasis in the lung bases. Heart size and pulmonary vascularity are normal for technique. No airspace disease or consolidation in the lungs. No blunting of costophrenic angles. No pneumothorax. IMPRESSION: Shallow inspiration.  No evidence of active pulmonary disease. Electronically Signed   By: Burman Nieves M.D.   On: 02/19/2018 23:32       Today   Subjective:   Hannah Jackson feeling better  Objective:   Blood pressure (!) 103/43, pulse 61, temperature (!) 97.5 F (36.4 C), temperature source Oral, resp. rate 18, height 5\' 4"  (1.626 m), weight 90.7 kg (199 lb 14.4 oz), SpO2 98 %.  .  Intake/Output Summary (Last 24  hours) at 02/24/2018 1213 Last data filed at 02/24/2018 0700 Gross per 24 hour  Intake 360 ml  Output 400 ml  Net -40 ml    Exam VITAL SIGNS: Blood pressure  (!) 103/43, pulse 61, temperature (!) 97.5 F (36.4 C), temperature source Oral, resp. rate 18, height 5\' 4"  (1.626 m), weight 90.7 kg (199 lb 14.4 oz), SpO2 98 %.  GENERAL:  84 y.o.-year-old patient lying in the bed with no acute distress.  EYES: Pupils equal, round, reactive to light and accommodation. No scleral icterus. Extraocular muscles intact.  HEENT: Head atraumatic, normocephalic. Oropharynx and nasopharynx clear.  NECK:  Supple, no jugular venous distention. No thyroid enlargement, no tenderness.  LUNGS: Normal breath sounds bilaterally, no wheezing, rales,rhonchi or crepitation. No use of accessory muscles of respiration.  CARDIOVASCULAR: S1, S2 normal. No murmurs, rubs, or gallops.  ABDOMEN: Soft, nontender, nondistended. Bowel sounds present. No organomegaly or mass.  EXTREMITIES: No pedal edema, cyanosis, or clubbing.  NEUROLOGIC: Cranial nerves II through XII are intact. Muscle strength 5/5 in all extremities. Sensation intact. Gait not checked.  PSYCHIATRIC: The patient is alert and oriented x 3.  SKIN: No obvious rash, lesion, or ulcer.   Data Review     CBC w Diff:  Lab Results  Component Value Date   WBC 10.1 02/20/2018   HGB 12.2 02/20/2018   HCT 38.0 02/20/2018   PLT 198 02/20/2018   CMP:  Lab Results  Component Value Date   NA 141 02/21/2018   K 3.9 02/21/2018   CL 106 02/21/2018   CO2 29 02/21/2018   BUN 20 02/21/2018   CREATININE 0.80 02/21/2018   PROT 7.5 02/19/2018   ALBUMIN 4.4 02/19/2018   BILITOT 0.5 02/19/2018   ALKPHOS 59 02/19/2018   AST 19 02/19/2018   ALT <5 (L) 02/19/2018  .  Micro Results Recent Results (from the past 240 hour(s))  Urine Culture     Status: None   Collection Time: 02/19/18 10:26 PM  Result Value Ref Range Status   Specimen Description   Final    URINE, RANDOM Performed at [facility], [address], [place], [place] [address]    Special Requests   Final    Normal Performed at [facility], [address], [place], [place] [address]    Culture   Final    NO GROWTH Performed at [facility], [address], [place], [place] [address]    Report Status 02/21/2018 FINAL  Final  Blood culture (routine x 2)     Status: None   Collection Time: 02/19/18 10:27 PM  Result Value Ref Range Status   Specimen Description BLOOD RIGHT HAND  Final   Special Requests   Final    BOTTLES DRAWN AEROBIC AND ANAEROBIC Blood Culture adequate volume   Culture   Final    NO GROWTH 5 DAYS Performed at [facility], [address], [place], [place] [address]    Report Status 02/24/2018 FINAL  Final  Blood culture (routine x 2)     Status: Abnormal   Collection Time: 02/19/18 10:27 PM  Result Value Ref Range Status   Specimen Description   Final    BLOOD LEFT HAND Performed at [facility], [address], [place], [place] [address]    Special Requests   Final    BOTTLES DRAWN AEROBIC AND ANAEROBIC Blood Culture results may not be optimal due to an excessive volume of blood received in culture  bottles Performed at [facility], [address] [place], [place] [address]    Culture  Setup Time   Final    GRAM POSITIVE COCCI IN BOTH AEROBIC AND ANAEROBIC BOTTLES CRITICAL RESULT CALLED TO, READ BACK BY AND VERIFIED WITH: MATT MCBANE  ON 02/21/18 AT 0200 JAG    Culture (A)  Final    STAPHYLOCOCCUS SPECIES (COAGULASE NEGATIVE) THE SIGNIFICANCE OF ISOLATING THIS ORGANISM FROM A SINGLE SET OF BLOOD CULTURES WHEN MULTIPLE SETS ARE DRAWN IS UNCERTAIN. PLEASE NOTIFY THE MICROBIOLOGY DEPARTMENT WITHIN ONE WEEK IF SPECIATION AND SENSITIVITIES ARE REQUIRED. Performed at [facility], [address] [place], [place] [address]    Report Status 02/23/2018 FINAL  Final  Blood Culture ID Panel (Reflexed)     Status: Abnormal   Collection Time: 02/19/18 10:27 PM  Result Value Ref Range Status   Enterococcus species NOT DETECTED NOT DETECTED Final   Listeria  monocytogenes NOT DETECTED NOT DETECTED Final   Staphylococcus species DETECTED (A) NOT DETECTED Final    Comment: Methicillin (oxacillin) susceptible coagulase negative staphylococcus. Possible blood culture contaminant (unless isolated from more than one blood culture draw or clinical case suggests pathogenicity). No antibiotic treatment is indicated for blood  culture contaminants. CRITICAL RESULT CALLED TO, READ BACK BY AND VERIFIED WITH: MATT MCBANE ON 02/21/18 AT 0200 JAG    Staphylococcus aureus NOT DETECTED NOT DETECTED Final   Methicillin resistance NOT DETECTED NOT DETECTED Final   Streptococcus species NOT DETECTED NOT DETECTED Final   Streptococcus agalactiae NOT DETECTED NOT DETECTED Final   Streptococcus pneumoniae NOT DETECTED NOT DETECTED Final   Streptococcus pyogenes NOT DETECTED NOT DETECTED Final   Acinetobacter baumannii NOT DETECTED NOT DETECTED Final   Enterobacteriaceae species NOT DETECTED NOT DETECTED Final   Enterobacter cloacae complex NOT DETECTED NOT DETECTED Final   Escherichia coli NOT DETECTED NOT DETECTED Final   Klebsiella oxytoca NOT DETECTED NOT DETECTED Final   Klebsiella pneumoniae NOT DETECTED NOT DETECTED Final   Proteus species NOT DETECTED NOT DETECTED Final   Serratia marcescens NOT DETECTED NOT DETECTED Final   Haemophilus influenzae NOT DETECTED NOT DETECTED Final   Neisseria meningitidis NOT DETECTED NOT DETECTED Final   Pseudomonas aeruginosa NOT DETECTED NOT DETECTED Final   Candida albicans NOT DETECTED NOT DETECTED Final   Candida glabrata NOT DETECTED NOT DETECTED Final   Candida krusei NOT DETECTED NOT DETECTED Final   Candida parapsilosis NOT DETECTED NOT DETECTED Final   Candida tropicalis NOT DETECTED NOT DETECTED Final    Comment: Performed at [facility], [address] [place], [place] [address]  MRSA PCR Screening     Status: None   Collection Time: 02/20/18  3:47 AM  Result Value Ref Range Status   MRSA by PCR  NEGATIVE NEGATIVE Final    Comment:        The GeneXpert MRSA Assay (FDA approved for NASAL specimens only), is one component of a comprehensive MRSA colonization surveillance program. It is not intended to diagnose MRSA infection nor to guide or monitor treatment for MRSA infections. Performed at [facility], [address], [place], [place] [address]         Code Status Orders  (From admission, onward)        Start     Ordered   02/20/18 2956  Full code  Continuous     02/20/18 0336    Code Status History    This patient has a current code status but  no historical code status.          Contact information for after-discharge care    Destination    [facility] .   Service:  Skilled Nursing Contact information: [address] [place] [address] [phone]              Discharge Medications   Allergies as of 02/24/2018      Reactions   Penicillins       Medication List    STOP taking these medications   predniSONE 20 MG tablet Commonly known as:  DELTASONE     TAKE these medications   acetaminophen 650 MG CR tablet Commonly known as:  TYLENOL Take 650 mg by mouth 2 (two) times daily. For arthritis pain/stiffness   albuterol (2.5 MG/3ML) 0.083% nebulizer solution Commonly known as:  PROVENTIL Take 2.5 mg by nebulization every 6 (six) hours as needed for wheezing or shortness of breath.   aspirin EC 81 MG tablet Take 81 mg by mouth daily.   AYR 0.65 % nasal spray Generic drug:  sodium chloride Place 2 sprays into the nose. 2 sprays in each nostril 3-4 times daily   budesonide 0.25 MG/2ML nebulizer solution Commonly known as:  PULMICORT Take 2 mLs (0.25 mg total) by nebulization 2 (two) times daily.   calcium carbonate 500 MG chewable tablet Commonly known as:  TUMS - dosed in mg elemental calcium Chew 1-2 tablets by mouth 3 (three) times daily as needed for indigestion or heartburn.    carbidopa-levodopa 25-100 MG tablet Commonly known as:  SINEMET IR Take 1 tablet by mouth 3 (three) times daily.   SINEMET CR 50-200 MG tablet Generic drug:  carbidopa-levodopa Take 1 tablet by mouth 2 (two) times daily. In the morning and at noon   cholecalciferol 1000 units tablet Commonly known as:  VITAMIN D Take 1,000 Units by mouth daily.   diclofenac sodium 1 % Gel Commonly known as:  VOLTAREN Apply 2 g topically 3 (three) times daily as needed (for wrist pain).   docusate sodium 100 MG capsule Commonly known as:  COLACE Take 100 mg by mouth daily as needed for mild constipation.   glucose 4 GM chewable tablet Chew 1 tablet (4 g total) by mouth 2 (two) times daily.   guaiFENesin 600 MG 12 hr tablet Commonly known as:  MUCINEX Take 600 mg by mouth 2 (two) times daily as needed.   guaiFENesin-dextromethorphan 100-10 MG/5ML syrup Commonly known as:  ROBITUSSIN DM Take 5 mLs by mouth every 4 (four) hours as needed for cough.   ibandronate 150 MG tablet Commonly known as:  BONIVA Take 150 mg by mouth every 30 (thirty) days. Take in the morning with a full glass of water, on an empty stomach, and do not take anything else by mouth or lie down for the next 60 min.   lactose free nutrition Liqd Take 237 mLs by mouth 3 (three) times daily with meals.   lidocaine 5 % Commonly known as:  LIDODERM Place 1 patch onto the skin daily. Remove & Discard patch within 12 hours   lisinopril 5 MG tablet Commonly known as:  PRINIVIL,ZESTRIL Take 5 mg by mouth daily.   MYRBETRIQ 25 MG Tb24 tablet Generic drug:  mirabegron ER Take 25 mg by mouth daily.   nortriptyline 50 MG capsule Commonly known as:  PAMELOR Take 50 mg by mouth every evening.   OPTIVE 0.5-0.9 % ophthalmic solution Generic drug:  carboxymethylcellul-glycerin Place 1-2 drops into  both eyes 4 (four) times daily as needed for dry eyes.   oxymetazoline 0.05 % nasal spray Commonly known as:  AFRIN Place 2  sprays into both nostrils as needed for congestion.   polyethylene glycol packet Commonly known as:  MIRALAX / GLYCOLAX Take 17 g by mouth daily as needed.   pregabalin 75 MG capsule Commonly known as:  LYRICA Take 75 mg by mouth 2 (two) times daily.   ranitidine 150 MG tablet Commonly known as:  ZANTAC Take 150 mg by mouth 2 (two) times daily.   senna-docusate 8.6-50 MG tablet Commonly known as:  Senokot-S Take 1 tablet by mouth daily.   Zinc Oxide 13 % Crea Apply 1 application topically as needed for irritation (apply cream to buttocks three times daily and as needed as a barrier cream).          Total Time in preparing paper work, data evaluation and todays exam - 35 minutes  Auburn BilberryShreyang Shiesha Jahn M.D on 02/24/2018 at 12:13 PM [facility]   Office  [phone]

## 2018-02-24 NOTE — Care Management (Addendum)
Notified home care providers that patient is to discharge to skilled nursing facility

## 2018-02-24 NOTE — Progress Notes (Signed)
Hannah Jackson to be D/C'd Skilled nursing facility([facility]) per MD order.  [facility] L[facility]. Discussed prescriptions and follow up appointments with the patient and sister. Medication list explained in detail. They verbalized understanding.  Allergies as of 02/24/2018      Reactions   Penicillins       Medication List    STOP taking these medications   predniSONE 20 MG tablet Commonly known as:  DELTASONE     TAKE these medications   acetaminophen 650 MG CR tablet Commonly known as:  TYLENOL Take 650 mg by mouth 2 (two) times daily. For arthritis pain/stiffness   albuterol (2.5 MG/3ML) 0.083% nebulizer solution Commonly known as:  PROVENTIL Take 2.5 mg by nebulization every 6 (six) hours as needed for wheezing or shortness of breath.   aspirin EC 81 MG tablet Take 81 mg by mouth daily.   AYR 0.65 % nasal spray Generic drug:  sodium chloride Place 2 sprays into the nose. 2 sprays in each nostril 3-4 times daily   budesonide 0.25 MG/2ML nebulizer solution Commonly known as:  PULMICORT Take 2 mLs (0.25 mg total) by nebulization 2 (two) times daily.   calcium carbonate 500 MG chewable tablet Commonly known as:  TUMS - dosed in mg elemental calcium Chew 1-2 tablets by mouth 3 (three) times daily as needed for indigestion or heartburn.   carbidopa-levodopa 25-100 MG tablet Commonly known as:  SINEMET IR Take 1 tablet by mouth 3 (three) times daily.   SINEMET CR 50-200 MG tablet Generic drug:  carbidopa-levodopa Take 1 tablet by mouth 2 (two) times daily. In the morning and at noon   cholecalciferol 1000 units tablet Commonly known as:  VITAMIN D Take 1,000 Units by mouth daily.   diclofenac sodium 1 % Gel Commonly known as:  VOLTAREN Apply 2 g topically 3 (three) times daily as needed (for wrist pain).   docusate sodium 100 MG capsule Commonly known as:  COLACE Take 100 mg by mouth daily as needed for mild constipation.   glucose 4 GM chewable  tablet Chew 1 tablet (4 g total) by mouth 2 (two) times daily.   guaiFENesin 600 MG 12 hr tablet Commonly known as:  MUCINEX Take 600 mg by mouth 2 (two) times daily as needed.   guaiFENesin-dextromethorphan 100-10 MG/5ML syrup Commonly known as:  ROBITUSSIN DM Take 5 mLs by mouth every 4 (four) hours as needed for cough.   ibandronate 150 MG tablet Commonly known as:  BONIVA Take 150 mg by mouth every 30 (thirty) days. Take in the morning with a full glass of water, on an empty stomach, and do not take anything else by mouth or lie down for the next 60 min.   lactose free nutrition Liqd Take 237 mLs by mouth 3 (three) times daily with meals.   lidocaine 5 % Commonly known as:  LIDODERM Place 1 patch onto the skin daily. Remove & Discard patch within 12 hours   lisinopril 5 MG tablet Commonly known as:  PRINIVIL,ZESTRIL Take 5 mg by mouth daily.   MYRBETRIQ 25 MG Tb24 tablet Generic drug:  mirabegron ER Take 25 mg by mouth daily.   nortriptyline 50 MG capsule Commonly known as:  PAMELOR Take 50 mg by mouth every evening.   OPTIVE 0.5-0.9 % ophthalmic solution Generic drug:  carboxymethylcellul-glycerin Place 1-2 drops into both eyes 4 (four) times daily as needed for dry eyes.   oxymetazoline 0.05 % nasal spray Commonly known as:  Rite AidFRIN Place  2 sprays into both nostrils as needed for congestion.   polyethylene glycol packet Commonly known as:  MIRALAX / GLYCOLAX Take 17 g by mouth daily as needed.   pregabalin 75 MG capsule Commonly known as:  LYRICA Take 75 mg by mouth 2 (two) times daily.   ranitidine 150 MG tablet Commonly known as:  ZANTAC Take 150 mg by mouth 2 (two) times daily.   senna-docusate 8.6-50 MG tablet Commonly known as:  Senokot-S Take 1 tablet by mouth daily.   Zinc Oxide 13 % Crea Apply 1 application topically as needed for irritation (apply cream to buttocks three times daily and as needed as a barrier cream).       Vitals:    02/24/18 0728 02/24/18 0848  BP:  (!) 103/43  Pulse:  61  Resp:    Temp:  (!) 97.5 F (36.4 C)  SpO2: 98% 98%    Tele box removed and returned. Skin clean, dry and intact without evidence of skin break down, no evidence of skin tears noted. IV catheter discontinued intact. Site without signs and symptoms of complications. Dressing and pressure applied. Pt denies pain at this time. No complaints noted.  An After Visit Summary was printed and given to the patient. Patient escorted via [facility], and D/C home via [facility].  Rigoberto Noel

## 2018-02-24 NOTE — Progress Notes (Signed)
Inpatient Diabetes Program Recommendations  AACE/ADA: New Consensus Statement on Inpatient Glycemic Control (2015)  Target Ranges:  Prepandial:   less than 140 mg/dL      Peak postprandial:   less than 180 mg/dL (1-2 hours)      Critically ill patients:  140 - 180 mg/dL  Results for Alford HighlandCRISP, Haydon M (MRN [id number]) as of 02/24/2018 10:07  Ref. Range 02/23/2018 07:40 02/23/2018 11:21 02/23/2018 17:06 02/23/2018 20:51 02/24/2018 08:48 02/24/2018 09:40  Glucose-Capillary Latest Ref Range: 65 - 99 mg/dL 621149 (H) 308241 (H) 657338 (H) 243 (H) 69 78  Results for Alford HighlandCRISP, Thomasa M (MRN [id number]) as of 02/24/2018 10:07  Ref. Range 02/21/2018 04:58  Hemoglobin A1C Latest Ref Range: 4.8 - 5.6 % 6.0 (H)    Review of Glycemic Control  Diabetes history: DM2 Outpatient Diabetes medications: None Current orders for Inpatient glycemic control: None  Inpatient Diabetes Program Recommendations: Correction (SSI): While inpatient and ordered steroids, please consider ordering Novolog 0-9 units TID with meals.  NOTE: Patient has DM2 hx and is ordered steroids as an inpatient which are contributing to hyperglycemia. Noted fasting glucose 69 mg/dl this morning and steroids were decreased for IV to PO Prednisone 50 mg QAM starting this morning.  Thanks, Orlando PennerMarie Dougles Kimmey, RN, MSN, CDE Diabetes Coordinator Inpatient Diabetes Program [phone] (Team Pager from 8am to 5pm)

## 2018-02-26 ENCOUNTER — Inpatient Hospital Stay
Admission: EM | Admit: 2018-02-26 | Discharge: 2018-02-28 | DRG: 637 | Disposition: A | Discharge status: Skilled Nursing Facility | Payer: Medicare (Managed Care) | Attending: Internal Medicine | Admitting: Internal Medicine

## 2018-02-26 ENCOUNTER — Other Ambulatory Visit: Payer: Self-pay

## 2018-02-26 ENCOUNTER — Encounter: Payer: Self-pay | Admitting: *Deleted

## 2018-02-26 DIAGNOSIS — E11649 Type 2 diabetes mellitus with hypoglycemia without coma: Secondary | ICD-10-CM | POA: Diagnosis not present

## 2018-02-26 DIAGNOSIS — Z8249 Family history of ischemic heart disease and other diseases of the circulatory system: Secondary | ICD-10-CM

## 2018-02-26 DIAGNOSIS — R0902 Hypoxemia: Secondary | ICD-10-CM | POA: Diagnosis not present

## 2018-02-26 DIAGNOSIS — I10 Essential (primary) hypertension: Secondary | ICD-10-CM | POA: Diagnosis present

## 2018-02-26 DIAGNOSIS — Z823 Family history of stroke: Secondary | ICD-10-CM

## 2018-02-26 DIAGNOSIS — Z880 Allergy status to penicillin: Secondary | ICD-10-CM | POA: Diagnosis not present

## 2018-02-26 DIAGNOSIS — J439 Emphysema, unspecified: Secondary | ICD-10-CM | POA: Diagnosis present

## 2018-02-26 DIAGNOSIS — G20 Parkinson's disease: Secondary | ICD-10-CM | POA: Diagnosis present

## 2018-02-26 DIAGNOSIS — Z79899 Other long term (current) drug therapy: Secondary | ICD-10-CM

## 2018-02-26 DIAGNOSIS — Z7982 Long term (current) use of aspirin: Secondary | ICD-10-CM

## 2018-02-26 DIAGNOSIS — Z806 Family history of leukemia: Secondary | ICD-10-CM

## 2018-02-26 DIAGNOSIS — E785 Hyperlipidemia, unspecified: Secondary | ICD-10-CM | POA: Diagnosis present

## 2018-02-26 DIAGNOSIS — M1990 Unspecified osteoarthritis, unspecified site: Secondary | ICD-10-CM | POA: Diagnosis present

## 2018-02-26 DIAGNOSIS — G9341 Metabolic encephalopathy: Secondary | ICD-10-CM | POA: Diagnosis present

## 2018-02-26 DIAGNOSIS — E162 Hypoglycemia, unspecified: Secondary | ICD-10-CM | POA: Diagnosis not present

## 2018-02-26 LAB — GLUCOSE, CAPILLARY
GLUCOSE-CAPILLARY: 80 mg/dL (ref 65–99)
GLUCOSE-CAPILLARY: 18 mg/dL — AB (ref 65–99)
GLUCOSE-CAPILLARY: 128 mg/dL — AB (ref 65–99)
GLUCOSE-CAPILLARY: 129 mg/dL — AB (ref 65–99)
Glucose-Capillary: 29 mg/dL — CL (ref 65–99)
Glucose-Capillary: 37 mg/dL — CL (ref 65–99)
Glucose-Capillary: 71 mg/dL (ref 65–99)
Glucose-Capillary: 127 mg/dL — ABNORMAL HIGH (ref 65–99)

## 2018-02-26 LAB — COMPREHENSIVE METABOLIC PANEL
ALBUMIN: 3.6 g/dL (ref 3.5–5.0)
ALT: 5 U/L — AB (ref 14–54)
AST: 18 U/L (ref 15–41)
Alkaline Phosphatase: 54 U/L (ref 38–126)
Anion gap: 6 (ref 5–15)
BILIRUBIN TOTAL: 0.7 mg/dL (ref 0.3–1.2)
BUN: 35 mg/dL — ABNORMAL HIGH (ref 6–20)
CHLORIDE: 99 mmol/L — AB (ref 101–111)
CO2: 30 mmol/L (ref 22–32)
CREATININE: 0.93 mg/dL (ref 0.44–1.00)
Calcium: 8.2 mg/dL — ABNORMAL LOW (ref 8.9–10.3)
GFR calc Af Amer: >60 mL/min (ref >60)
GFR, EST NON AFRICAN AMERICAN: 54 mL/min — AB (ref >60)
GLUCOSE: 382 mg/dL — AB (ref 65–99)
POTASSIUM: 4.1 mmol/L (ref 3.5–5.1)
Sodium: 135 mmol/L (ref 135–145)
Total Protein: 6.4 g/dL — ABNORMAL LOW (ref 6.5–8.1)

## 2018-02-26 LAB — CBC WITH DIFFERENTIAL/PLATELET
Basophils Absolute: 0 10*3/uL (ref 0–0.1)
Basophils Relative: 0 %
Eosinophils Absolute: 0.3 10*3/uL (ref 0–0.7)
Eosinophils Relative: 2 %
HCT: 40.8 % (ref 35.0–47.0)
HEMOGLOBIN: 13.1 g/dL (ref 12.0–16.0)
LYMPHS ABS: 1.5 10*3/uL (ref 1.0–3.6)
Lymphocytes Relative: 10 %
MCH: 27.9 pg (ref 26.0–34.0)
MCHC: 32.1 g/dL (ref 32.0–36.0)
MCV: 86.9 fL (ref 80.0–100.0)
MONOS PCT: 7 %
Monocytes Absolute: 1 10*3/uL — ABNORMAL HIGH (ref 0.2–0.9)
NEUTROS PCT: 81 %
Neutro Abs: 11.7 10*3/uL — ABNORMAL HIGH (ref 1.4–6.5)
Platelets: 224 10*3/uL (ref 150–440)
RBC: 4.7 MIL/uL (ref 3.80–5.20)
RDW: 15.9 % — ABNORMAL HIGH (ref 11.5–14.5)
WBC: 14.5 10*3/uL — AB (ref 3.6–11.0)

## 2018-02-26 MED ORDER — DEXTROSE 50 % IV SOLN
INTRAVENOUS | Status: AC
  Administered 2018-02-26: 50 mL
  Filled 2018-02-26: qty 50

## 2018-02-26 MED ORDER — DEXTROSE 50 % IV SOLN
INTRAVENOUS | Status: AC
Start: 2018-02-26 — End: 2018-02-27
  Filled 2018-02-26: qty 50

## 2018-02-26 MED ORDER — DEXTROSE 50 % IV SOLN
1.0000 | Freq: Once | INTRAVENOUS | Status: AC
  Administered 2018-02-26: 50 mL via INTRAVENOUS
  Filled 2018-02-26: qty 50

## 2018-02-26 MED ORDER — DEXTROSE-NACL 5-0.45 % IV SOLN
INTRAVENOUS | Status: DC
  Administered 2018-02-26: 20:00:00 via INTRAVENOUS

## 2018-02-26 NOTE — ED Notes (Signed)
fsbs 160  md aware.

## 2018-02-26 NOTE — ED Notes (Signed)
Pt awake and alert. Skin warm and dry.

## 2018-02-26 NOTE — ED Notes (Signed)
Pt waiting on admission.

## 2018-02-26 NOTE — ED Notes (Signed)
Iv in place by dr siadecki  meds given.

## 2018-02-26 NOTE — ED Triage Notes (Signed)
[facility] brought [facility] with low blood sugar.  On arrival to treatment room fsbs 29.  Pt stuck numerous times for iv without success.  md doing u/s iv now.

## 2018-02-26 NOTE — ED Notes (Signed)
fsbs 18.  md aware.  d50 given again.  Pt ate dinner tray a few minutes ago.

## 2018-02-26 NOTE — ED Notes (Signed)
fsbs 37   md aware.  meds given again.

## 2018-02-26 NOTE — ED Notes (Signed)
fsbs 128  md aware.   nsr onmonitor.

## 2018-02-26 NOTE — ED Notes (Signed)
Report off to kala rn 

## 2018-02-26 NOTE — ED Notes (Signed)
BS 127

## 2018-02-26 NOTE — ED Provider Notes (Signed)
[facility] Emergency Department Provider Note ____________________________________________   First MD Initiated Contact with Patient 02/26/18 1702     (approximate)  I have reviewed the triage vital signs and the nursing notes.   HISTORY  Chief Complaint Hypoglycemia  Level 5 caveat: History of present illness limited due to altered mental status  HPI Hannah Jackson is a 84 y.o. female with PMH as noted below who presents with hypoglycemia.  The patient was admitted for respiratory failure earlier this week, and also was noted to have recurrent hypoglycemia despite not being on insulin or any oral hypoglycemics.  Patient had some workup and was pending further outpatient workup of her hypoglycemia since the situation had stabilized.  She was placed on glucose tablets twice daily.  Today, at her rehab facility, the patient was noted to be hypoglycemic again 229.  The patient was noted to be somnolent but responsive.   Past Medical History:  Diagnosis Date  . Asthma   . Diabetes mellitus without complication (HCC)    on metformin  . HLD (hyperlipidemia)   . HTN (hypertension)   . Parkinson's disease [facility])     Patient Active Problem List   Diagnosis Date Noted  . UTI (urinary tract infection) 02/20/2018  . COPD (chronic obstructive pulmonary disease) (HCC)   . Asthma exacerbation 02/19/2018  . HTN (hypertension) 02/19/2018  . HLD (hyperlipidemia) 02/19/2018  . Diabetes (HCC) 02/19/2018  . Parkinson's disease (HCC) 02/19/2018    Past Surgical History:  Procedure Laterality Date  . BACK SURGERY      Prior to Admission medications   Medication Sig Start Date End Date Taking? Authorizing Provider  acetaminophen (TYLENOL) 650 MG CR tablet Take 650 mg by mouth 2 (two) times daily. For arthritis pain/stiffness    [provider]  albuterol (PROVENTIL) (2.5 MG/3ML) 0.083% nebulizer solution Take 2.5 mg by nebulization every 6 (six) hours as  needed for wheezing or shortness of breath.    [provider]  aspirin EC 81 MG tablet Take 81 mg by mouth daily.    [provider]  budesonide (PULMICORT) 0.25 MG/2ML nebulizer solution Take 2 mLs (0.25 mg total) by nebulization 2 (two) times daily. 02/24/18   Auburn Bilberry, MD  calcium carbonate (TUMS - DOSED IN MG ELEMENTAL CALCIUM) 500 MG chewable tablet Chew 1-2 tablets by mouth 3 (three) times daily as needed for indigestion or heartburn.    [provider]  carbidopa-levodopa (SINEMET CR) 50-200 MG tablet Take 1 tablet by mouth 2 (two) times daily. In the morning and at noon    [provider]  carbidopa-levodopa (SINEMET IR) 25-100 MG tablet Take 1 tablet by mouth 3 (three) times daily.    [provider]  carboxymethylcellul-glycerin (OPTIVE) 0.5-0.9 % ophthalmic solution Place 1-2 drops into both eyes 4 (four) times daily as needed for dry eyes.    [provider]  cholecalciferol (VITAMIN D) 1000 units tablet Take 1,000 Units by mouth daily.    [provider]  diclofenac sodium (VOLTAREN) 1 % GEL Apply 2 g topically 3 (three) times daily as needed (for wrist pain).    [provider]  docusate sodium (COLACE) 100 MG capsule Take 100 mg by mouth daily as needed for mild constipation.    [provider]  glucose 4 GM chewable tablet Chew 1 tablet (4 g total) by mouth 2 (two) times daily. 02/24/18   Auburn Bilberry, MD  guaiFENesin (MUCINEX) 600 MG 12 hr tablet  Take 600 mg by mouth 2 (two) times daily as needed.    [provider]  guaiFENesin-dextromethorphan (ROBITUSSIN DM) 100-10 MG/5ML syrup Take 5 mLs by mouth every 4 (four) hours as needed for cough. 02/24/18   Auburn Bilberry, MD  ibandronate (BONIVA) 150 MG tablet Take 150 mg by mouth every 30 (thirty) days. Take in the morning with a full glass of water, on an empty stomach, and do not take anything else by mouth or lie down for the next 60 min.     [provider]  lactose free nutrition (BOOST PLUS) LIQD Take 237 mLs by mouth 3 (three) times daily with meals. 02/24/18   Auburn Bilberry, MD  lidocaine (LIDODERM) 5 % Place 1 patch onto the skin daily. Remove & Discard patch within 12 hours    [provider]  lisinopril (PRINIVIL,ZESTRIL) 5 MG tablet Take 5 mg by mouth daily.    [provider]  mirabegron ER (MYRBETRIQ) 25 MG TB24 tablet Take 25 mg by mouth daily.    [provider]  nortriptyline (PAMELOR) 50 MG capsule Take 50 mg by mouth every evening.    [provider]  oxymetazoline (AFRIN) 0.05 % nasal spray Place 2 sprays into both nostrils as needed for congestion.    [provider]  polyethylene glycol (MIRALAX / GLYCOLAX) packet Take 17 g by mouth daily as needed.    [provider]  pregabalin (LYRICA) 75 MG capsule Take 75 mg by mouth 2 (two) times daily.    [provider]  ranitidine (ZANTAC) 150 MG tablet Take 150 mg by mouth 2 (two) times daily.    [provider]  senna-docusate (SENOKOT-S) 8.6-50 MG tablet Take 1 tablet by mouth daily.    [provider]  sodium chloride (AYR) 0.65 % nasal spray Place 2 sprays into the nose. 2 sprays in each nostril 3-4 times daily    [provider]  Zinc Oxide 13 % CREA Apply 1 application topically as needed for irritation (apply cream to buttocks three times daily and as needed as a barrier cream).    [provider]    Allergies Penicillins  Family History  Problem Relation Age of Onset  . Leukemia Mother   . Stroke Mother   . CAD Father   . Hypertension Father   . Heart attack Father     Social History Social History   Tobacco Use  . Smoking status: Never Smoker  . Smokeless tobacco: Never Used  Substance Use Topics  . Alcohol use: No  . Drug use: No    Review of Systems Level 5 caveat: Unable to obtain review of systems due to altered mental  status    ____________________________________________   PHYSICAL EXAM:  VITAL SIGNS: ED Triage Vitals  Enc Vitals Group     BP 02/26/18 1722 (!) 160/83     Pulse Rate 02/26/18 1722 69     Resp 02/26/18 1722 20     Temp 02/26/18 1722 98.6 F (37 C)     Temp Source 02/26/18 1722 Oral     SpO2 02/26/18 1722 98 %     Weight 02/26/18 1723 199 lb (90.3 kg)     Height 02/26/18 1723 5\' 4"  (1.626 m)     Head Circumference --      Peak Flow --      Pain Score 02/26/18 1723 0     Pain Loc --      Pain Edu? --  Excl. in GC? --     Constitutional: Somnolent but arousable, answering some questions. Eyes: Conjunctivae are normal.  EOMI. Head: Atraumatic. Nose: No congestion/rhinnorhea. Mouth/Throat: Mucous membranes are somewhat dry.   Neck: Normal range of motion.  Cardiovascular: Normal rate, regular rhythm. Grossly normal heart sounds.  Good peripheral circulation. Respiratory: Normal respiratory effort.  No retractions. Lungs CTAB. Gastrointestinal: Soft and nontender. No distention.  Genitourinary: No flank tenderness. Musculoskeletal: Extremities warm and well perfused.  Neurologic: Motor intact in all extremities.  No gross focal neurologic deficits are appreciated.  Skin:  Skin is warm and dry. No rash noted. Psychiatric: Unable to assess.  ____________________________________________   LABS (all labs ordered are listed, but only abnormal results are displayed)  Labs Reviewed  GLUCOSE, CAPILLARY - Abnormal; Notable for the following components:      Result Value   Glucose-Capillary 29 (*)    All other components within normal limits  CBC WITH DIFFERENTIAL/PLATELET - Abnormal; Notable for the following components:   WBC 14.5 (*)    RDW 15.9 (*)    Neutro Abs 11.7 (*)    Monocytes Absolute 1.0 (*)    All other components within normal limits  GLUCOSE, CAPILLARY - Abnormal; Notable for the following components:   Glucose-Capillary 37 (*)    All other  components within normal limits  COMPREHENSIVE METABOLIC PANEL - Abnormal; Notable for the following components:   Chloride 99 (*)    Glucose, Bld 382 (*)    BUN 35 (*)    Calcium 8.2 (*)    Total Protein 6.4 (*)    ALT 5 (*)    GFR calc non Af Amer 54 (*)    All other components within normal limits  GLUCOSE, CAPILLARY - Abnormal; Notable for the following components:   Glucose-Capillary 18 (*)    All other components within normal limits  GLUCOSE, CAPILLARY - Abnormal; Notable for the following components:   Glucose-Capillary 128 (*)    All other components within normal limits  GLUCOSE, CAPILLARY  TSH  T3, FREE  T4, FREE  URINALYSIS, COMPLETE (UACMP) WITH MICROSCOPIC   ____________________________________________  EKG  ED ECG REPORT I, Dionne BucySebastian Isidoro Santillana, the attending physician, personally viewed and interpreted this ECG.  Date: 02/26/2018 EKG Time: 1722 Rate: 67 Rhythm: normal sinus rhythm QRS Axis: normal Intervals: normal ST/T Wave abnormalities: LVH Narrative Interpretation: no evidence of acute ischemia  ____________________________________________  RADIOLOGY    ____________________________________________   PROCEDURES  Procedure(s) performed: No  Procedures  Critical Care performed: No ____________________________________________   INITIAL IMPRESSION / ASSESSMENT AND PLAN / ED COURSE  Pertinent labs & imaging results that were available during my care of the patient were reviewed by me and considered in my medical decision making (see chart for details).  84 year old female with PMH as noted above presents with hypoglycemia today, despite not being on insulin or any oral hypoglycemics, and persisting after eating.  I reviewed the past medical records in Epic; the patient was admitted earlier this week for respiratory failure, and was noted to have recurrent hypoglycemia at this time.  Her glucose was stabilized and she was started on oral  glucose pills.  She was pending further outpatient workup.  The patient's primary care provider, Dr. Purcell Moutoneilly, came to the ED to see her and I discussed the patient's case with her in person.  The cause and differential for the recurrent hypoglycemia are unclear.  Based on my discussion with Dr. Victory Dakiniley, we will plan for basic labs, thyroid  labs, and we will observe the patient for 4-6 hours.  If her glucose stabilizes and she is asymptomatic, will consider discharge back to her facility which the patient would prefer.  If she has persistent hypoglycemia or concerning lab findings, we will admit.    ----------------------------------------- 8:52 PM on 02/26/2018 -----------------------------------------  Patient had recurrent hypoglycemia even after getting D50 and eating.  I gave a second dose of D50 and placed the patient on a D5 half NS infusion.  She is stable at this time, however given the recurrent episode of hypoglycemia I feel it is not safe to discharge her at this time.  I discussed this with the patient and the family and they expressed agreement.  I signed the patient out to the hospitalist Dr. Katheren Shams.  ____________________________________________   FINAL CLINICAL IMPRESSION(S) / ED DIAGNOSES  Final diagnoses:  Hypoglycemia      NEW MEDICATIONS STARTED DURING THIS VISIT:  New Prescriptions   No medications on file     Note:  This document was prepared using Dragon voice recognition software and may include unintentional dictation errors.    Dionne Bucy, MD 02/26/18 2054

## 2018-02-26 NOTE — ED Notes (Signed)
fsbs 80 after 2nd amp d50.  md aware.  Pt eating dinner tray now.  Family with pt.

## 2018-02-26 NOTE — ED Notes (Signed)
fsbs 71   md aware.

## 2018-02-26 NOTE — ED Notes (Signed)
fsbs 129   md aware.  Pt waiting on admission.

## 2018-02-26 NOTE — ED Notes (Signed)
On arrival to er pt fsbs 29.  Difficult iv stick   Iv started by md with u/s  meds given.  Pt drank 1 orange juice after meds.  nsr on monitor.  Pt talking more after meds and oj.  Skin warm and dry.

## 2018-02-26 NOTE — ED Notes (Signed)
Pt unable to void at this time  md aware.  

## 2018-02-26 NOTE — H&P (Signed)
[facility] [place] [facility]   PATIENT NAME: Hannah MaxinKatie Jackson    MR#:  [id number]  DATE OF BIRTH:  June 20, 1928  DATE OF ADMISSION:  02/26/2018  PRIMARY CARE PHYSICIAN: System, Pcp Not In   REQUESTING/REFERRING PHYSICIAN:   CHIEF COMPLAINT:   Chief Complaint  Patient presents with  . Hypoglycemia    HISTORY OF PRESENT ILLNESS: Hannah Jackson  is a 84 y.o. female with a known history of diabetes type 2, asthma, hyperlipidemia, hypertension and Parkinson's disease. Pt can't provide history, due to confusion. The information was taken from reviewing the MR and from discussion with the family and the ER physician. Patient was brought to ER for hypoglycemia, glucose levels in 20s, lethargy and confusion, going on for the past 12 hrs. She was noted to have low blood sugar, in 40s in the past 7 days . Per family, pt eats well, she did not lose wt. Pt was hospitalized one week ago for acute COPD exacerbation and was treated with steroids, but the glucose levels were still low, in 40s. She was discharged on glucose tabs and sent to SNF. There is a history of DM2 in this pt, but she hasn't been on any antidiabetic meds for the past few months.  No new medications. While in the emergency room, patient was given D50, IV twice and also ate, but still not able to maintain the blood sugar above 60 on her own.  Patient was started on D5 half NS infusion and admitted to the hospital for further evaluation and treatment.  PAST MEDICAL HISTORY:   Past Medical History:  Diagnosis Date  . Asthma   . Diabetes mellitus without complication (HCC)    on metformin  . HLD (hyperlipidemia)   . HTN (hypertension)   . Parkinson's disease (HCC)     PAST SURGICAL HISTORY:  Past Surgical History:  Procedure Laterality Date  . BACK SURGERY      SOCIAL HISTORY:  Social History   Tobacco Use  . Smoking status: Never Smoker  . Smokeless tobacco: Never Used  Substance Use Topics  .  Alcohol use: No    FAMILY HISTORY:  Family History  Problem Relation Age of Onset  . Leukemia Mother   . Stroke Mother   . CAD Father   . Hypertension Father   . Heart attack Father     DRUG ALLERGIES:  Allergies  Allergen Reactions  . Penicillins     Has patient had a PCN reaction causing immediate rash, facial/tongue/throat swelling, SOB or lightheadedness with hypotension: Unknown Has patient had a PCN reaction causing severe rash involving mucus membranes or skin necrosis: Unknown Has patient had a PCN reaction that required hospitalization: Unknown Has patient had a PCN reaction occurring within the last 10 years: Unknown If all of the above answers are "NO", then may proceed with Cephalosporin use.     REVIEW OF SYSTEMS:   CONSTITUTIONAL: No fever, but complains of fatigue and generalized weakness.  EYES: No vision changes.  EARS, NOSE, AND THROAT: No tinnitus or ear pain.  RESPIRATORY: No cough, shortness of breath, wheezing or hemoptysis.  CARDIOVASCULAR: No chest pain, orthopnea, edema.  GASTROINTESTINAL: No nausea, vomiting, diarrhea or abdominal pain.  GENITOURINARY: No dysuria, hematuria.  ENDOCRINE: No polyuria, nocturia,  HEMATOLOGY: No bleeding SKIN: No rash or lesion. MUSCULOSKELETAL: No joint pain.   NEUROLOGIC: No focal weakness.  PSYCHIATRY: No anxiety or depression.   MEDICATIONS AT HOME:  Prior to Admission medications  Medication Sig Start Date End Date Taking? Authorizing Provider  acetaminophen (TYLENOL) 650 MG CR tablet Take 650 mg by mouth 2 (two) times daily. For arthritis pain/stiffness   Yes [provider]  albuterol (PROVENTIL) (2.5 MG/3ML) 0.083% nebulizer solution Take 2.5 mg by nebulization every 6 (six) hours as needed for wheezing or shortness of breath.   Yes [provider]  aspirin EC 81 MG tablet Take 81 mg by mouth daily.   Yes [provider]  budesonide (PULMICORT) 0.25 MG/2ML nebulizer solution Take  2 mLs (0.25 mg total) by nebulization 2 (two) times daily. 02/24/18  Yes Auburn Bilberry, MD  calcium carbonate (TUMS - DOSED IN MG ELEMENTAL CALCIUM) 500 MG chewable tablet Chew 1-2 tablets by mouth 3 (three) times daily as needed for indigestion or heartburn.   Yes [provider]  carbidopa-levodopa (SINEMET CR) 50-200 MG tablet Take 1 tablet by mouth 2 (two) times daily. In the morning and at noon   Yes [provider]  carbidopa-levodopa (SINEMET IR) 25-100 MG tablet Take 1 tablet by mouth 3 (three) times daily.   Yes [provider]  carboxymethylcellul-glycerin (OPTIVE) 0.5-0.9 % ophthalmic solution Place 1-2 drops into both eyes 4 (four) times daily as needed for dry eyes.   Yes [provider]  cholecalciferol (VITAMIN D) 1000 units tablet Take 1,000 Units by mouth daily.   Yes [provider]  diclofenac sodium (VOLTAREN) 1 % GEL Apply 2 g topically 3 (three) times daily as needed (for wrist pain).   Yes [provider]  docusate sodium (COLACE) 100 MG capsule Take 100 mg by mouth daily as needed for mild constipation.   Yes [provider]  glucose 4 GM chewable tablet Chew 1 tablet (4 g total) by mouth 2 (two) times daily. 02/24/18  Yes Auburn Bilberry, MD  guaiFENesin (MUCINEX) 600 MG 12 hr tablet Take 600 mg by mouth 2 (two) times daily as needed for to loosen phlegm.    Yes [provider]  guaiFENesin-dextromethorphan (ROBITUSSIN DM) 100-10 MG/5ML syrup Take 5 mLs by mouth every 4 (four) hours as needed for cough. 02/24/18  Yes Auburn Bilberry, MD  ibandronate (BONIVA) 150 MG tablet Take 150 mg by mouth every 30 (thirty) days. Take in the morning with a full glass of water, on an empty stomach, and do not take anything else by mouth or lie down for the next 60 min.   Yes [provider]  lactose free nutrition (BOOST PLUS) LIQD Take 237 mLs by mouth 3 (three) times daily with meals. 02/24/18  Yes Auburn Bilberry, MD   lidocaine (LIDODERM) 5 % Place 1 patch onto the skin daily. Remove & Discard patch within 12 hours   Yes [provider]  lisinopril (PRINIVIL,ZESTRIL) 5 MG tablet Take 5 mg by mouth daily.   Yes [provider]  mirabegron ER (MYRBETRIQ) 25 MG TB24 tablet Take 25 mg by mouth daily.   Yes [provider]  polyethylene glycol (MIRALAX / GLYCOLAX) packet Take 17 g by mouth daily as needed.   Yes [provider]  pregabalin (LYRICA) 75 MG capsule Take 75 mg by mouth 2 (two) times daily.   Yes [provider]  ranitidine (ZANTAC) 150 MG tablet Take 150 mg by mouth 2 (two) times daily.   Yes [provider]  senna-docusate (SENOKOT-S) 8.6-50 MG tablet Take 1 tablet by mouth daily.   Yes [provider]  Skin Protectants, Misc. (BAZA PROTECT EX) Apply 1  application topically 3 (three) times daily as needed (wound).   Yes [provider]  sodium chloride (AYR) 0.65 % nasal spray Place 2 sprays into the nose. 2 sprays in each nostril 3-4 times daily   Yes [provider]  Zinc Oxide 13 % CREA Apply 1 application topically as needed for irritation (apply cream to buttocks three times daily and as needed as a barrier cream).   Yes [provider]      PHYSICAL EXAMINATION:   VITAL SIGNS: Blood pressure (!) 161/78, pulse 69, temperature 98.6 F (37 C), temperature source Oral, resp. rate 17, height 5\' 4"  (1.626 m), weight 90.3 kg (199 lb), SpO2 98 %.  GENERAL:  84 y.o.-year-old patient lying in the bed with no acute distress.  She looks confused, lethargic. EYES: Pupils equal, round, reactive to light and accommodation. No scleral icterus.  HEENT: Head atraumatic, normocephalic. Oropharynx and nasopharynx clear.  NECK:  Supple, no jugular venous distention. No thyroid enlargement, no tenderness.  LUNGS: Normal breath sounds bilaterally, no wheezing, rales,rhonchi or crepitation. No use of accessory muscles of  respiration.  CARDIOVASCULAR: S1, S2 normal. No murmurs, rubs, or gallops.  ABDOMEN: Soft, nontender, nondistended. Bowel sounds present. No organomegaly or mass.  EXTREMITIES: No pedal edema, cyanosis, or clubbing.  NEUROLOGIC: No focal weakness.  PSYCHIATRIC: The patient is confused, lethargic.  SKIN: No obvious rash, lesion, or ulcer.   LABORATORY PANEL:   CBC Recent Labs  Lab 02/20/18 0708 02/26/18 1707  WBC 10.1 14.5*  HGB 12.2 13.1  HCT 38.0 40.8  PLT 198 224  MCV 86.4 86.9  MCH 27.8 27.9  MCHC 32.2 32.1  RDW 15.2* 15.9*  LYMPHSABS  --  1.5  MONOABS  --  1.0*  EOSABS  --  0.3  BASOSABS  --  0.0   ------------------------------------------------------------------------------------------------------------------  Chemistries  Recent Labs  Lab 02/20/18 0708 02/21/18 0458 02/26/18 1825  NA 142 141 135  K 4.4 3.9 4.1  CL 108 106 99*  CO2 28 29 30   GLUCOSE 189* 148* 382*  BUN 24* 20 35*  CREATININE 0.73 0.80 0.93  CALCIUM 8.5* 8.5* 8.2*  AST  --   --  18  ALT  --   --  5*  ALKPHOS  --   --  54  BILITOT  --   --  0.7   ------------------------------------------------------------------------------------------------------------------ estimated creatinine clearance is 46.4 mL/min (by C-G formula based on SCr of 0.93 mg/dL). ------------------------------------------------------------------------------------------------------------------ No results for input(s): TSH, T4TOTAL, T3FREE, THYROIDAB in the last 72 hours.  Invalid input(s): FREET3   Coagulation profile No results for input(s): INR, PROTIME in the last 168 hours. ------------------------------------------------------------------------------------------------------------------- No results for input(s): DDIMER in the last 72 hours. -------------------------------------------------------------------------------------------------------------------  Cardiac Enzymes No results for input(s): CKMB,  TROPONINI, MYOGLOBIN in the last 168 hours.  Invalid input(s): CK ------------------------------------------------------------------------------------------------------------------ Invalid input(s): POCBNP  ---------------------------------------------------------------------------------------------------------------  Urinalysis    Component Value Date/Time   COLORURINE YELLOW (A) 02/19/2018 2226   APPEARANCEUR CLOUDY (A) 02/19/2018 2226   LABSPEC 1.029 02/19/2018 2226   PHURINE 5.0 02/19/2018 2226   GLUCOSEU NEGATIVE 02/19/2018 2226   HGBUR LARGE (A) 02/19/2018 2226   BILIRUBINUR NEGATIVE 02/19/2018 2226   KETONESUR 5 (A) 02/19/2018 2226   PROTEINUR 30 (A) 02/19/2018 2226   NITRITE NEGATIVE 02/19/2018 2226   LEUKOCYTESUR TRACE (A) 02/19/2018 2226     RADIOLOGY: No results found.  EKG: Orders placed or performed during the hospital encounter of 02/26/18  . EKG 12-Lead  . EKG 12-Lead  IMPRESSION AND PLAN:  1.  Acute metabolic encephalopathy, likely secondary to hypoglycemia, improving with correction of the blood sugar level.  Continue to monitor clinically closely, while treating the hypoglycemia. 2.  Hypoglycemia, of unclear etiology, in a patient with history of diabetes type 2.  Currently, not on any antidiabetic medications.  We will continue to support with IV dextrose.  We will check thyroid function tests, plasma C-peptide, plasma insulin and plasma beta hydroxybutyrate.  Patient is to continue to follow-up with endocrinology as high as outpatient, through [facility] program.  3.  Hypertension, stable, will restart home medications. 4.  Parkinson's disease, stable, will continue home medications.  All the records are reviewed and case discussed with ED provider. Management plans discussed with the patient, family and they are in agreement.  CODE STATUS: Code Status History    Date Active Date Inactive Code Status Order ID Comments User Context   02/20/2018 0336  02/24/2018 1727 Full Code [id number]  Oralia Manis, MD Inpatient       TOTAL TIME TAKING CARE OF THIS PATIENT:  45 minutes.    Cammy Copa M.D on 02/26/2018 at 11:20 PM  Between 7am to 6pm - Pager - [phone]  After 6pm go to [email or web address] - password [email or web address]  [facility]  Office  [phone]  CC: Primary care physician; System, Pcp Not In

## 2018-02-27 ENCOUNTER — Other Ambulatory Visit: Payer: Self-pay

## 2018-02-27 ENCOUNTER — Inpatient Hospital Stay: Payer: Medicare (Managed Care)

## 2018-02-27 LAB — BASIC METABOLIC PANEL
Anion gap: 4 — ABNORMAL LOW (ref 5–15)
BUN: 24 mg/dL — AB (ref 6–20)
CALCIUM: 8.3 mg/dL — AB (ref 8.9–10.3)
CO2: 29 mmol/L (ref 22–32)
Chloride: 102 mmol/L (ref 101–111)
Creatinine, Ser: 0.62 mg/dL (ref 0.44–1.00)
GFR calc Af Amer: >60 mL/min (ref >60)
GLUCOSE: 173 mg/dL — AB (ref 65–99)
Potassium: 4.1 mmol/L (ref 3.5–5.1)
Sodium: 135 mmol/L (ref 135–145)

## 2018-02-27 LAB — GLUCOSE, CAPILLARY
GLUCOSE-CAPILLARY: 91 mg/dL (ref 65–99)
GLUCOSE-CAPILLARY: 80 mg/dL (ref 65–99)
GLUCOSE-CAPILLARY: 69 mg/dL (ref 65–99)
GLUCOSE-CAPILLARY: 70 mg/dL (ref 65–99)
GLUCOSE-CAPILLARY: 110 mg/dL — AB (ref 65–99)
Glucose-Capillary: 160 mg/dL — ABNORMAL HIGH (ref 65–99)
Glucose-Capillary: 69 mg/dL (ref 65–99)
Glucose-Capillary: 80 mg/dL (ref 65–99)
Glucose-Capillary: 91 mg/dL (ref 65–99)

## 2018-02-27 LAB — TSH: TSH: 1.843 u[IU]/mL (ref 0.350–4.500)

## 2018-02-27 LAB — CBC
HCT: 39.2 % (ref 35.0–47.0)
Hemoglobin: 12.5 g/dL (ref 12.0–16.0)
MCH: 27.6 pg (ref 26.0–34.0)
MCHC: 31.7 g/dL — AB (ref 32.0–36.0)
MCV: 87 fL (ref 80.0–100.0)
Platelets: 190 10*3/uL (ref 150–440)
RBC: 4.51 MIL/uL (ref 3.80–5.20)
RDW: 15.3 % — AB (ref 11.5–14.5)
WBC: 13.4 10*3/uL — ABNORMAL HIGH (ref 3.6–11.0)

## 2018-02-27 LAB — BETA-HYDROXYBUTYRIC ACID: BETA-HYDROXYBUTYRIC ACID: 0.08 mmol/L (ref 0.05–0.27)

## 2018-02-27 LAB — T4, FREE: FREE T4: 1.06 ng/dL (ref 0.61–1.12)

## 2018-02-27 MED ORDER — BISACODYL 5 MG PO TBEC: 5.0000 mg | DELAYED_RELEASE_TABLET | Freq: Every day | ORAL | Status: DC | PRN

## 2018-02-27 MED ORDER — CARBIDOPA-LEVODOPA 25-100 MG PO TABS
1.0000 | ORAL_TABLET | Freq: Three times a day (TID) | ORAL | Status: DC
  Administered 2018-02-27 – 2018-02-28 (×4): 1 via ORAL
  Filled 2018-02-27 (×6): qty 1

## 2018-02-27 MED ORDER — PREGABALIN 75 MG PO CAPS
75.0000 mg | ORAL_CAPSULE | Freq: Two times a day (BID) | ORAL | Status: DC
  Administered 2018-02-27 – 2018-02-28 (×3): 75 mg via ORAL
  Filled 2018-02-27 (×3): qty 1

## 2018-02-27 MED ORDER — DOCUSATE SODIUM 100 MG PO CAPS
100.0000 mg | ORAL_CAPSULE | Freq: Two times a day (BID) | ORAL | Status: DC
  Administered 2018-02-27 – 2018-02-28 (×3): 100 mg via ORAL
  Filled 2018-02-27 (×3): qty 1

## 2018-02-27 MED ORDER — DICLOFENAC SODIUM 1 % TD GEL
2.0000 g | Freq: Three times a day (TID) | TRANSDERMAL | Status: DC | PRN
  Filled 2018-02-27: qty 100

## 2018-02-27 MED ORDER — ENSURE ENLIVE PO LIQD
237.0000 mL | Freq: Three times a day (TID) | ORAL | Status: DC
  Administered 2018-02-27 – 2018-02-28 (×4): 237 mL via ORAL

## 2018-02-27 MED ORDER — LISINOPRIL 5 MG PO TABS
5.0000 mg | ORAL_TABLET | Freq: Every day | ORAL | Status: DC
  Administered 2018-02-27 – 2018-02-28 (×2): 5 mg via ORAL
  Filled 2018-02-27 (×2): qty 1

## 2018-02-27 MED ORDER — PHENOL 1.4 % MT LIQD
1.0000 | OROMUCOSAL | Status: DC | PRN
  Administered 2018-02-27: 1 via OROMUCOSAL
  Filled 2018-02-27: qty 177

## 2018-02-27 MED ORDER — CALCIUM CARBONATE ANTACID 500 MG PO CHEW: 1.0000 | CHEWABLE_TABLET | Freq: Three times a day (TID) | ORAL | Status: DC | PRN

## 2018-02-27 MED ORDER — ASPIRIN EC 81 MG PO TBEC
81.0000 mg | DELAYED_RELEASE_TABLET | Freq: Every day | ORAL | Status: DC
  Administered 2018-02-27 – 2018-02-28 (×2): 81 mg via ORAL
  Filled 2018-02-27 (×2): qty 1

## 2018-02-27 MED ORDER — HYDROCODONE-ACETAMINOPHEN 5-325 MG PO TABS: 1.0000 | ORAL_TABLET | ORAL | Status: DC | PRN

## 2018-02-27 MED ORDER — GLUCOSE 4 G PO CHEW
1.0000 | CHEWABLE_TABLET | Freq: Two times a day (BID) | ORAL | Status: DC
  Administered 2018-02-27 – 2018-02-28 (×3): 4 g via ORAL
  Filled 2018-02-27 (×3): qty 1

## 2018-02-27 MED ORDER — ACETAMINOPHEN 650 MG RE SUPP: 650.0000 mg | Freq: Four times a day (QID) | RECTAL | Status: DC | PRN

## 2018-02-27 MED ORDER — BUDESONIDE 0.25 MG/2ML IN SUSP
0.2500 mg | Freq: Two times a day (BID) | RESPIRATORY_TRACT | Status: DC
  Administered 2018-02-27 – 2018-02-28 (×2): 0.25 mg via RESPIRATORY_TRACT
  Filled 2018-02-27 (×2): qty 2

## 2018-02-27 MED ORDER — ONDANSETRON HCL 4 MG/2ML IJ SOLN: 4.0000 mg | Freq: Four times a day (QID) | INTRAMUSCULAR | Status: DC | PRN

## 2018-02-27 MED ORDER — CARBOXYMETHYLCELLUL-GLYCERIN 0.5-0.9 % OP SOLN: 1.0000 [drp] | Freq: Four times a day (QID) | OPHTHALMIC | Status: DC | PRN

## 2018-02-27 MED ORDER — ALBUTEROL SULFATE (2.5 MG/3ML) 0.083% IN NEBU: 2.5000 mg | INHALATION_SOLUTION | Freq: Four times a day (QID) | RESPIRATORY_TRACT | Status: DC | PRN

## 2018-02-27 MED ORDER — TRAZODONE HCL 50 MG PO TABS
25.0000 mg | ORAL_TABLET | Freq: Every evening | ORAL | Status: DC | PRN
Start: 2018-02-27 — End: 2018-02-28

## 2018-02-27 MED ORDER — ACETAMINOPHEN 325 MG PO TABS: 650.0000 mg | ORAL_TABLET | Freq: Four times a day (QID) | ORAL | Status: DC | PRN

## 2018-02-27 MED ORDER — CARBIDOPA-LEVODOPA ER 50-200 MG PO TBCR
1.0000 | EXTENDED_RELEASE_TABLET | Freq: Two times a day (BID) | ORAL | Status: DC
  Administered 2018-02-27 – 2018-02-28 (×2): 1 via ORAL
  Filled 2018-02-27 (×4): qty 1

## 2018-02-27 MED ORDER — HEPARIN SODIUM (PORCINE) 5000 UNIT/ML IJ SOLN
5000.0000 [IU] | Freq: Three times a day (TID) | INTRAMUSCULAR | Status: DC
  Administered 2018-02-27 – 2018-02-28 (×4): 5000 [IU] via SUBCUTANEOUS
  Filled 2018-02-27 (×4): qty 1

## 2018-02-27 MED ORDER — VITAMIN D 1000 UNITS PO TABS
1000.0000 [IU] | ORAL_TABLET | Freq: Every day | ORAL | Status: DC
  Administered 2018-02-27 – 2018-02-28 (×2): 1000 [IU] via ORAL
  Filled 2018-02-27 (×2): qty 1

## 2018-02-27 MED ORDER — POLYVINYL ALCOHOL 1.4 % OP SOLN
1.0000 [drp] | Freq: Four times a day (QID) | OPHTHALMIC | Status: DC | PRN
  Filled 2018-02-27: qty 15

## 2018-02-27 MED ORDER — ONDANSETRON HCL 4 MG PO TABS: 4.0000 mg | ORAL_TABLET | Freq: Four times a day (QID) | ORAL | Status: DC | PRN

## 2018-02-27 MED ORDER — LIDOCAINE 5 % EX PTCH
1.0000 | MEDICATED_PATCH | CUTANEOUS | Status: DC
  Filled 2018-02-27 (×2): qty 1

## 2018-02-27 MED ORDER — BUDESONIDE 0.25 MG/2ML IN SUSP
0.2500 mg | Freq: Two times a day (BID) | RESPIRATORY_TRACT | Status: DC
  Administered 2018-02-27: 0.25 mg via RESPIRATORY_TRACT
  Filled 2018-02-27: qty 2

## 2018-02-27 MED ORDER — FAMOTIDINE 20 MG PO TABS
10.0000 mg | ORAL_TABLET | Freq: Every day | ORAL | Status: DC
  Administered 2018-02-27 – 2018-02-28 (×2): 10 mg via ORAL
  Filled 2018-02-27 (×2): qty 1

## 2018-02-27 NOTE — Clinical Social Work Note (Addendum)
Clinical Social Work Assessment  Patient Details  Name: Hannah Jackson MRN: [id number] Date of Birth: [birth year]  Date of referral:                  Reason for consult:  Facility Placement                Permission sought to share information with:  Chartered certified accountant granted to share information::  Yes, Verbal Permission Granted  Name::      IT sales professional::   Christmas  Relationship::     Contact Information:     Housing/Transportation Living arrangements for the past 2 months:  [place] of Information:  Siblings Patient Interpreter Needed:  None Criminal Activity/Legal Involvement Pertinent to Current Situation/Hospitalization:  No - Comment as needed Significant Relationships:  Siblings Lives with:  Facility Resident Do you feel safe going back to the place where you live?  Yes Need for family participation in patient care:  Yes (Comment)  Care giving concerns: Patient has been a short-term resident at [facility] since Monday (02/24/18).    Social Worker assessment / plan: Holiday representative (CSW) reviewed patient's chart and noted that she is from [employer] Patient was discharged from [facility] on Monday 02/24/18 to [facility]. Patient is contracted under [facility]. Social work Theatre manager met with patient and siblings Hannah Jackson [phone]) and Hannah Jackson [phone]) at bedside. Patient was sitting up in bed alert and oriented to self, time, and place. Patient's siblings participated in assessment. Social work Theatre manager introduced self and explained the role of the [place]. Per Hannah Jackson, patient has been at [facility] since Monday and is agreeable for her to return once discharged. Per Hannah Jackson, patient uses a walker to assist with ambulation.  FL2 completed. Per Georgetta Haber staff plan is for patient to return to [facility]. Per [facility] admissions coordinator at [facility] patient can return when stable.    Employment status:   Retired Forensic scientist:  Managed Care PT Recommendations:  Not assessed at this time Information / Referral to community resources:  [facility]  Patient/Family's Response to care: Patient and patient's sibling are agreeable for patient to return to [employer] for [facility].  Patient/Family's Understanding of and Emotional Response to Diagnosis, Current Treatment, and Prognosis: Patient and her siblings were pleasant and thanked social work Theatre manager for her assistance.  Emotional Assessment Appearance:  Appears stated age Attitude/Demeanor/Rapport:    Affect (typically observed):  Calm, Accepting, Pleasant Orientation:  Oriented to Self, Oriented to Place, Oriented to  Time Alcohol / Substance use:  Not Applicable Psych involvement (Current and /or in the community):  No (Comment)  Discharge Needs  Concerns to be addressed:  Care Coordination, Discharge Planning Concerns Readmission within the last 30 days:  Yes Current discharge risk:  Dependent with Mobility Barriers to Discharge:  Continued Medical Work up   Smith Mince, Student-Social Work 02/27/2018, 11:45 AM

## 2018-02-27 NOTE — Progress Notes (Addendum)
Per Valorie RooseveltNicole [facility] employee plan is [facility].   Baker Hughes IncorporatedBailey Aaryan Essman, LCSW [phone]

## 2018-02-27 NOTE — Progress Notes (Signed)
[facility] - [place] [facility]                                                                                                                                                                                  Patient Demographics   Hannah MaxinKatie Jackson, is a 84 y.o. female, DOB - 1928/09/08, YNW:[id number]  Admit date - 02/26/2018   Admitting Physician Cammy CopaAngela Maier, MD  Outpatient Primary MD for the patient is System, Pcp Not In   LOS - 1  Subjective: Patient seen and evaluated by me this morning Verbally responsive and awake Had low oxygen saturation this morning and was put on oxygen by nasal cannula No complaints of any chest pain No orthopnea  Review of Systems:   CONSTITUTIONAL: No documented fever. Has fatigue, weakness. No weight gain, no weight loss.  EYES: No blurry or double vision.  ENT: No tinnitus. No postnasal drip. No redness of the oropharynx.  RESPIRATORY: No cough, no wheeze, no hemoptysis. No dyspnea.  CARDIOVASCULAR: No chest pain. No orthopnea. No palpitations. No syncope.  GASTROINTESTINAL: No nausea, no vomiting or diarrhea. No abdominal pain. No melena or hematochezia.  GENITOURINARY: No dysuria or hematuria.  ENDOCRINE: No polyuria or nocturia. No heat or cold intolerance.  HEMATOLOGY: No anemia. No bruising. No bleeding.  INTEGUMENTARY: No rashes. No lesions.  MUSCULOSKELETAL: No arthritis. No swelling. No gout.  NEUROLOGIC: No numbness, tingling, or ataxia. No seizure-type activity.  PSYCHIATRIC: No anxiety. No insomnia. No ADD.    Vitals:   Vitals:   02/27/18 0000 02/27/18 0104 02/27/18 0421 02/27/18 0817  BP: (!) 150/62 (!) 170/61 (!) 171/63 (!) 155/57  Pulse:  69 66 68  Resp: 13 (!) 24 (!) 24 20  Temp:  97.7 F (36.5 C) 98.6 F (37 C) 98.5 F (36.9 C)  TempSrc:  Oral Oral Oral  SpO2:   97% (!) 79%  Weight:      Height:        Wt Readings from Last 3 Encounters:  02/26/18 90.3 kg (199 lb)  02/23/18 90.7 kg (199 lb 14.4 oz)   09/24/17 92.1 kg (203 lb)     Intake/Output Summary (Last 24 hours) at 02/27/2018 1339 Last data filed at 02/27/2018 1052 Gross per 24 hour  Intake 1000 ml  Output 800 ml  Net 200 ml    Physical Exam:   GENERAL: Elderly female patient lying on the bed.  HEAD, EYES, EARS, NOSE AND THROAT: Atraumatic, normocephalic. Extraocular muscles are intact. Pupils equal and reactive to light. Sclerae anicteric. No conjunctival injection. No oro-pharyngeal erythema.  NECK: Supple. There is no jugular venous distention. No bruits, no lymphadenopathy, no thyromegaly.  HEART:  Regular rate and rhythm,. No murmurs, no rubs, no clicks.  LUNGS: Clear to auscultation bilaterally. No rales or rhonchi. No wheezes.  ABDOMEN: Soft, flat, nontender, nondistended. Has good bowel sounds. No hepatosplenomegaly appreciated.  EXTREMITIES: No evidence of any cyanosis, clubbing, or peripheral edema.  +2 pedal and radial pulses bilaterally.  NEUROLOGIC: The patient is alert, awake, and oriented x3 with no focal motor or sensory deficits appreciated bilaterally.  SKIN: Moist and warm with no rashes appreciated.  Psych: Not anxious, depressed LN: No inguinal LN enlargement    Antibiotics   Anti-infectives (From admission, onward)   None      Medications   Scheduled Meds: . aspirin EC  81 mg Oral Daily  . budesonide  0.25 mg Nebulization BID  . carbidopa-levodopa  1 tablet Oral BID  . carbidopa-levodopa  1 tablet Oral TID  . cholecalciferol  1,000 Units Oral Daily  . docusate sodium  100 mg Oral BID  . famotidine  10 mg Oral Daily  . feeding supplement (ENSURE ENLIVE)  237 mL Oral TID WC  . glucose  1 tablet Oral BID  . heparin  5,000 Units Subcutaneous Q8H  . lidocaine  1 patch Transdermal Q24H  . lisinopril  5 mg Oral Daily  . pregabalin  75 mg Oral BID   Continuous Infusions: . dextrose 5 % and 0.45% NaCl 100 mL/hr at 02/26/18 1931   PRN Meds:.acetaminophen **OR** acetaminophen, albuterol,  bisacodyl, calcium carbonate, diclofenac sodium, HYDROcodone-acetaminophen, ondansetron **OR** ondansetron (ZOFRAN) IV, phenol, polyvinyl alcohol, traZODone   Data Review:   Micro Results Recent Results (from the past 240 hour(s))  Urine Culture     Status: None   Collection Time: 02/19/18 10:26 PM  Result Value Ref Range Status   Specimen Description   Final    URINE, RANDOM Performed at [facility], [address] [place], [place] [address]    Special Requests   Final    Normal Performed at [facility], [address], [place], [place] [address]    Culture   Final    NO GROWTH Performed at [facility], [address], [place], [place] [address]    Report Status 02/21/2018 FINAL  Final  Blood culture (routine x 2)     Status: None   Collection Time: 02/19/18 10:27 PM  Result Value Ref Range Status   Specimen Description BLOOD RIGHT HAND  Final   Special Requests   Final    BOTTLES DRAWN AEROBIC AND ANAEROBIC Blood Culture adequate volume   Culture   Final    NO GROWTH 5 DAYS Performed at [facility], [address] [place], [place] [address]    Report Status 02/24/2018 FINAL  Final  Blood culture (routine x 2)     Status: Abnormal   Collection Time: 02/19/18 10:27 PM  Result Value Ref Range Status   Specimen Description   Final    BLOOD LEFT HAND Performed at [facility], [address], [place], [place] [address]    Special Requests   Final    BOTTLES DRAWN AEROBIC AND ANAEROBIC Blood Culture results may not be optimal due to an excessive volume of blood received in culture bottles Performed at [facility], [address] [place], [place] [address]    Culture  Setup Time   Final    GRAM POSITIVE COCCI IN BOTH AEROBIC AND ANAEROBIC BOTTLES CRITICAL RESULT CALLED TO, READ BACK BY AND VERIFIED WITH: MATT MCBANE  ON  02/21/18 AT 0200 JAG    Culture (A)  Final    STAPHYLOCOCCUS SPECIES (COAGULASE  NEGATIVE) THE SIGNIFICANCE OF ISOLATING THIS ORGANISM FROM A SINGLE SET OF BLOOD CULTURES WHEN MULTIPLE SETS ARE DRAWN IS UNCERTAIN. PLEASE NOTIFY THE MICROBIOLOGY DEPARTMENT WITHIN ONE WEEK IF SPECIATION AND SENSITIVITIES ARE REQUIRED. Performed at [facility], [address] [place], [place] [address]    Report Status 02/23/2018 FINAL  Final  Blood Culture ID Panel (Reflexed)     Status: Abnormal   Collection Time: 02/19/18 10:27 PM  Result Value Ref Range Status   Enterococcus species NOT DETECTED NOT DETECTED Final   Listeria monocytogenes NOT DETECTED NOT DETECTED Final   Staphylococcus species DETECTED (A) NOT DETECTED Final    Comment: Methicillin (oxacillin) susceptible coagulase negative staphylococcus. Possible blood culture contaminant (unless isolated from more than one blood culture draw or clinical case suggests pathogenicity). No antibiotic treatment is indicated for blood  culture contaminants. CRITICAL RESULT CALLED TO, READ BACK BY AND VERIFIED WITH: MATT MCBANE ON 02/21/18 AT 0200 JAG    Staphylococcus aureus NOT DETECTED NOT DETECTED Final   Methicillin resistance NOT DETECTED NOT DETECTED Final   Streptococcus species NOT DETECTED NOT DETECTED Final   Streptococcus agalactiae NOT DETECTED NOT DETECTED Final   Streptococcus pneumoniae NOT DETECTED NOT DETECTED Final   Streptococcus pyogenes NOT DETECTED NOT DETECTED Final   Acinetobacter baumannii NOT DETECTED NOT DETECTED Final   Enterobacteriaceae species NOT DETECTED NOT DETECTED Final   Enterobacter cloacae complex NOT DETECTED NOT DETECTED Final   Escherichia coli NOT DETECTED NOT DETECTED Final   Klebsiella oxytoca NOT DETECTED NOT DETECTED Final   Klebsiella pneumoniae NOT DETECTED NOT DETECTED Final   Proteus species NOT DETECTED NOT DETECTED Final   Serratia marcescens NOT DETECTED NOT DETECTED Final   Haemophilus influenzae NOT DETECTED NOT DETECTED Final   Neisseria meningitidis NOT DETECTED NOT  DETECTED Final   Pseudomonas aeruginosa NOT DETECTED NOT DETECTED Final   Candida albicans NOT DETECTED NOT DETECTED Final   Candida glabrata NOT DETECTED NOT DETECTED Final   Candida krusei NOT DETECTED NOT DETECTED Final   Candida parapsilosis NOT DETECTED NOT DETECTED Final   Candida tropicalis NOT DETECTED NOT DETECTED Final    Comment: Performed at [facility], [address] [place], [place] [address]  MRSA PCR Screening     Status: None   Collection Time: 02/20/18  3:47 AM  Result Value Ref Range Status   MRSA by PCR NEGATIVE NEGATIVE Final    Comment:        The GeneXpert MRSA Assay (FDA approved for NASAL specimens only), is one component of a comprehensive MRSA colonization surveillance program. It is not intended to diagnose MRSA infection nor to guide or monitor treatment for MRSA infections. Performed at [facility], [address] [place], [place] [address]     Radiology Reports Dg Esophagus  Result Date: 02/24/2018 CLINICAL DATA:  Difficulty swallowing. Dementia, Parkinson's disease. EXAM: ESOPHOGRAM/BARIUM SWALLOW TECHNIQUE: Single contrast examination was performed using thin barium or water soluble. The patient ingested the barium through a straw all in the semi recumbent position. FLUOROSCOPY TIME:  Fluoroscopy Time:  0 minutes, 48 seconds Radiation Exposure Index (if provided by the fluoroscopic device): 24.6 mGy Number of Acquired Spot Images: 3+1 video loop COMPARISON:  None in PACs FINDINGS: The patient ingested the thin barium through a straw without difficulty. No cough was elicited. Positioning was difficult for assessing the hypopharynx and cervical esophagus  but no laryngeal or tracheal or bronchial penetration of the barium was detected on subsequent images. The thoracic esophagus was tortuous. There were prominent tertiary contractions. Appropriate gauge in of the primary peristaltic wave was weak with very limited secondary stripping  wave activity. There was delayed relaxation of the lower esophageal sphincter but it did ultimately relax but was noted to be narrow in caliber. IMPRESSION: Findings compatible with presbyesophagus. I cannot exclude a fairly long segment distal esophageal stricture. No evidence of aspiration. Direct visualization of the distal esophagus would be useful if the patient can tolerate the procedure. Electronically Signed   By: David  Swaziland M.D.   On: 02/24/2018 08:50   Dg Chest Port 1 View  Result Date: 02/27/2018 CLINICAL DATA:  Hypoxia EXAM: PORTABLE CHEST 1 VIEW COMPARISON:  02/19/2018 FINDINGS: Normal heart size and mediastinal contours. Atherosclerotic calcification. There is no edema, consolidation, effusion, or pneumothorax. IMPRESSION: No evidence of active disease. Electronically Signed   By: Marnee Spring M.D.   On: 02/27/2018 09:26   Dg Chest Portable 1 View  Result Date: 02/19/2018 CLINICAL DATA:  Respiratory distress. Cough, lethargy, desaturation. History of asthma and COPD. EXAM: PORTABLE CHEST 1 VIEW COMPARISON:  02/06/2016 FINDINGS: Shallow inspiration with linear atelectasis in the lung bases. Heart size and pulmonary vascularity are normal for technique. No airspace disease or consolidation in the lungs. No blunting of costophrenic angles. No pneumothorax. IMPRESSION: Shallow inspiration.  No evidence of active pulmonary disease. Electronically Signed   By: Burman Nieves M.D.   On: 02/19/2018 23:32     CBC Recent Labs  Lab 02/26/18 1707 02/27/18 0717  WBC 14.5* 13.4*  HGB 13.1 12.5  HCT 40.8 39.2  PLT 224 190  MCV 86.9 87.0  MCH 27.9 27.6  MCHC 32.1 31.7*  RDW 15.9* 15.3*  LYMPHSABS 1.5  --   MONOABS 1.0*  --   EOSABS 0.3  --   BASOSABS 0.0  --     Chemistries  Recent Labs  Lab 02/21/18 0458 02/26/18 1825 02/27/18 0717  NA 141 135 135  K 3.9 4.1 4.1  CL 106 99* 102  CO2 29 30 29   GLUCOSE 148* 382* 173*  BUN 20 35* 24*  CREATININE 0.80 0.93 0.62  CALCIUM  8.5* 8.2* 8.3*  AST  --  18  --   ALT  --  5*  --   ALKPHOS  --  54  --   BILITOT  --  0.7  --    ------------------------------------------------------------------------------------------------------------------ estimated creatinine clearance is 53.9 mL/min (by C-G formula based on SCr of 0.62 mg/dL). ------------------------------------------------------------------------------------------------------------------ No results for input(s): HGBA1C in the last 72 hours. ------------------------------------------------------------------------------------------------------------------ No results for input(s): CHOL, HDL, LDLCALC, TRIG, CHOLHDL, LDLDIRECT in the last 72 hours. ------------------------------------------------------------------------------------------------------------------ Recent Labs    02/27/18 0717  TSH 1.843   ------------------------------------------------------------------------------------------------------------------ No results for input(s): VITAMINB12, FOLATE, FERRITIN, TIBC, IRON, RETICCTPCT in the last 72 hours.  Coagulation profile No results for input(s): INR, PROTIME in the last 168 hours.  No results for input(s): DDIMER in the last 72 hours.  Cardiac Enzymes No results for input(s): CKMB, TROPONINI, MYOGLOBIN in the last 168 hours.  Invalid input(s): CK ------------------------------------------------------------------------------------------------------------------ Invalid input(s): POCBNP    Assessment & Plan   84 year old elderly female patient with history of diabetes mellitus type 2, bronchial asthma, hyperlipidemia, hypertension, Parkinson's disease was admitted to hospitalist service for hypoglycemia.  1.  Acute metabolic encephalopathy secondary to hypoglycemia Improved No new episodes of hypoglycemia  2 hypoxia this morning.  Oxygen via nasal cannula at 2 L Check chest x-ray for any evidence of infection  3.  Hypertension Continue  home medications  4.  Parkinson's disease stable Continue carbidopa levodopa 5.  Physical therapy evaluation for endurance training     Code Status Orders  (From admission, onward)        Start     Ordered   02/27/18 0415  Full code  Continuous     02/27/18 0414    Code Status History    Date Active Date Inactive Code Status Order ID Comments User Context   02/20/2018 0336 02/24/2018 1727 Full Code [id number]  Oralia Manis, MD Inpatient      Time Spent in minutes   36 minutes  Greater than 50% of time spent in care coordination and counseling patient regarding the condition and plan of care.   Ihor Austin M.D on 02/27/2018 at 1:39 PM  Between 7am to 6pm - Pager - [phone]  After 6pm go to [email or web address] - Social research officer, government  [employer]   Office  [phone]

## 2018-02-27 NOTE — [place] FL2 (Signed)
[place] MEDICAID FL2 LEVEL OF CARE SCREENING TOOL     IDENTIFICATION  Patient Name: Hannah Jackson Birthdate: Dec 07, 1927 Sex: female Admission Date (Current Location): 02/26/2018  [place] and [place] Number:  Chiropodist and Address:  [facility], [address] [place], [place] [address]      Provider Number: [id number]  Attending Physician Name and Address:  Ihor Austin, MD  Relative Name and Phone Number:       Current Level of Care: Hospital Recommended Level of Care: Skilled Nursing Facility Prior Approval Number:    Date Approved/Denied:   PASRR Number: [id number])  Discharge Plan: SNF    Current Diagnoses: Patient Active Problem List   Diagnosis Date Noted  . Hypoglycemia 02/26/2018  . UTI (urinary tract infection) 02/20/2018  . COPD (chronic obstructive pulmonary disease) (HCC)   . Asthma exacerbation 02/19/2018  . HTN (hypertension) 02/19/2018  . HLD (hyperlipidemia) 02/19/2018  . Diabetes (HCC) 02/19/2018  . Parkinson's disease (HCC) 02/19/2018    Orientation RESPIRATION BLADDER Height & Weight     Self, Time, Place  O2(2L Nasal Cannula) Incontinent Weight: 199 lb (90.3 kg) Height:  5\' 4"  (162.6 cm)  BEHAVIORAL SYMPTOMS/MOOD NEUROLOGICAL BOWEL NUTRITION STATUS      Continent Diet(Regular)  AMBULATORY STATUS COMMUNICATION OF NEEDS Skin   Extensive Assist Verbally Normal                       Personal Care Assistance Level of Assistance  Bathing, Feeding, Dressing Bathing Assistance: Limited assistance Feeding assistance: Independent Dressing Assistance: Limited assistance     Functional Limitations Info  Sight, Hearing, Speech Sight Info: Adequate Hearing Info: Adequate Speech Info: Adequate    SPECIAL CARE FACTORS FREQUENCY  PT (By licensed PT), OT (By licensed OT)     PT Frequency: (5) OT Frequency: (5)            Contractures      Additional Factors Info  Code Status, Allergies  Code Status Info: (Full Code) Allergies Info: (PENICILLINS )           Current Medications (02/27/2018):  This is the current hospital active medication list Current Facility-Administered Medications  Medication Dose Route Frequency Provider Last Rate Last Dose  . acetaminophen (TYLENOL) tablet 650 mg  650 mg Oral Q6H PRN Cammy Copa, MD       Or  . acetaminophen (TYLENOL) suppository 650 mg  650 mg Rectal Q6H PRN Cammy Copa, MD      . albuterol (PROVENTIL) (2.5 MG/3ML) 0.083% nebulizer solution 2.5 mg  2.5 mg Nebulization Q6H PRN Cammy Copa, MD      . aspirin EC tablet 81 mg  81 mg Oral Daily Cammy Copa, MD   81 mg at 02/27/18 1125  . bisacodyl (DULCOLAX) EC tablet 5 mg  5 mg Oral Daily PRN Cammy Copa, MD      . budesonide (PULMICORT) nebulizer solution 0.25 mg  0.25 mg Nebulization BID Cammy Copa, MD   0.25 mg at 02/27/18 1019  . calcium carbonate (TUMS - dosed in mg elemental calcium) chewable tablet 200-400 mg of elemental calcium  1-2 tablet Oral TID PRN Cammy Copa, MD      . carbidopa-levodopa (SINEMET CR) 50-200 MG per tablet controlled release 1 tablet  1 tablet Oral BID Cammy Copa, MD      . carbidopa-levodopa (SINEMET IR) 25-100 MG per tablet immediate release 1 tablet  1 tablet Oral TID Cammy Copa, MD  1 tablet at 02/27/18 1126  . cholecalciferol (VITAMIN D) tablet 1,000 Units  1,000 Units Oral Daily Cammy CopaMaier, Angela, MD   1,000 Units at 02/27/18 1125  . dextrose 5 %-0.45 % sodium chloride infusion   Intravenous Continuous Cammy CopaMaier, Angela, MD 100 mL/hr at 02/26/18 1931    . diclofenac sodium (VOLTAREN) 1 % transdermal gel 2 g  2 g Topical TID PRN Cammy CopaMaier, Angela, MD      . docusate sodium (COLACE) capsule 100 mg  100 mg Oral BID Cammy CopaMaier, Angela, MD   100 mg at 02/27/18 1123  . famotidine (PEPCID) tablet 10 mg  10 mg Oral Daily Cammy CopaMaier, Angela, MD   10 mg at 02/27/18 1126  . feeding supplement (ENSURE ENLIVE) (ENSURE ENLIVE) liquid 237 mL  237 mL Oral TID WC Cammy CopaMaier,  Angela, MD   237 mL at 02/27/18 1129  . glucose chewable tablet 4 g  1 tablet Oral BID Cammy CopaMaier, Angela, MD   4 g at 02/27/18 1128  . heparin injection 5,000 Units  5,000 Units Subcutaneous Q8H Cammy CopaMaier, Angela, MD   5,000 Units at 02/27/18 0522  . HYDROcodone-acetaminophen (NORCO/VICODIN) 5-325 MG per tablet 1-2 tablet  1-2 tablet Oral Q4H PRN Cammy CopaMaier, Angela, MD      . lidocaine (LIDODERM) 5 % 1 patch  1 patch Transdermal Q24H Cammy CopaMaier, Angela, MD      . lisinopril (PRINIVIL,ZESTRIL) tablet 5 mg  5 mg Oral Daily Cammy CopaMaier, Angela, MD      . ondansetron [facility](ZOFRAN) tablet 4 mg  4 mg Oral Q6H PRN Cammy CopaMaier, Angela, MD       Or  . ondansetron [facility](ZOFRAN) injection 4 mg  4 mg Intravenous Q6H PRN Cammy CopaMaier, Angela, MD      . phenol (CHLORASEPTIC) mouth spray 1 spray  1 spray Mouth/Throat PRN Cammy CopaMaier, Angela, MD   1 spray at 02/27/18 0522  . polyvinyl alcohol (LIQUIFILM TEARS) 1.4 % ophthalmic solution 1-2 drop  1-2 drop Both Eyes QID PRN Cammy CopaMaier, Angela, MD      . pregabalin (LYRICA) capsule 75 mg  75 mg Oral BID Cammy CopaMaier, Angela, MD   75 mg at 02/27/18 1125  . traZODone (DESYREL) tablet 25 mg  25 mg Oral QHS PRN Cammy CopaMaier, Angela, MD         Discharge Medications: Please see discharge summary for a list of discharge medications.  Relevant Imaging Results:  Relevant Lab Results:   Additional Information (SSN: [id number])  Payton SparkAnanda A Yanis Larin, Student-Social Work

## 2018-02-27 NOTE — Progress Notes (Signed)
CBG recheck after two 4oz oj is 91

## 2018-02-27 NOTE — Progress Notes (Signed)
Inpatient Diabetes Program Recommendations  AACE/ADA: New Consensus Statement on Inpatient Glycemic Control (2015)  Target Ranges:  Prepandial:   less than 140 mg/dL      Peak postprandial:   less than 180 mg/dL (1-2 hours)      Critically ill patients:  140 - 180 mg/dL   Results for Alford HighlandCRISP, Kaula M (MRN [id number]) as of 02/27/2018 13:05  Ref. Range 02/26/2018 21:32 02/26/2018 22:28 02/26/2018 23:19 02/27/2018 01:24 02/27/2018 01:48 02/27/2018 04:03 02/27/2018 08:13  Glucose-Capillary Latest Ref Range: 65 - 99 mg/dL 71 725129 (H) 366127 (H) 69 80 91 80  Results for Alford HighlandCRISP, Tasfia M (MRN [id number]) as of 02/27/2018 13:05  Ref. Range 02/26/2018 17:01 02/26/2018 17:48 02/26/2018 18:10 02/26/2018 19:21 02/26/2018 19:39 02/26/2018 20:01  Glucose-Capillary Latest Ref Range: 65 - 99 mg/dL 29 (LL) 37 (LL) 80 18 (LL) 128 (H) 160 (H)    Results for Alford HighlandCRISP, Tiann M (MRN [id number]) as of 02/27/2018 13:05  Ref. Range 02/26/2018 18:25 02/27/2018 07:17  Beta-Hydroxybutyric Acid Latest Ref Range: 0.05 - 0.27 mmol/L  0.08  Glucose Latest Ref Range: 65 - 99 mg/dL 332382 (H) 951173 (H)  Results for Alford HighlandCRISP, Myeasha M (MRN [id number]) as of 02/27/2018 13:05  Ref. Range 02/21/2018 04:58  Hemoglobin A1C Latest Ref Range: 4.8 - 5.6 % 6.0 (H)   Review of Glycemic Control  Diabetes history:DM2  Outpatient Diabetes medications: None Current orders for Inpatient glycemic control: CBG monitoring  Inpatient Diabetes Program Recommendations:  Hypoglycemia: If hypoglycemia continues recommend doing finger stick and lab draw at the same time to ensure finger sticks are accurate. If hypoglycemia persist, attending MD may want to call Endocrinologist directly to discuss patient. In the past Dr. Tedd SiasSolum has been willing to talk with MD directly for her recommendations. Please note that if an inpatient Endocrinologist consult is ordered, no one receives it as Endocrinologist is no longer coming into the hospital to assist with inpatient glycemic control.    Thanks, Orlando PennerMarie Susen Haskew, RN, MSN, CDE Diabetes Coordinator Inpatient Diabetes Program [phone] (Team Pager from 8am to 5pm)

## 2018-02-28 LAB — GLUCOSE, CAPILLARY
Glucose-Capillary: 225 mg/dL — ABNORMAL HIGH (ref 65–99)
Glucose-Capillary: 117 mg/dL — ABNORMAL HIGH (ref 65–99)
Glucose-Capillary: 112 mg/dL — ABNORMAL HIGH (ref 65–99)

## 2018-02-28 LAB — T3, FREE: T3, Free: 2.4 pg/mL (ref 2.0–4.4)

## 2018-02-28 LAB — INSULIN AND C-PEPTIDE, SERUM
C-Peptide: 4.6 ng/mL — ABNORMAL HIGH (ref 1.1–4.4)
Insulin: 20.2 u[IU]/mL (ref 2.6–24.9)

## 2018-02-28 NOTE — Progress Notes (Signed)
Patient is medically stable for [facility]. [facility] patient can come today to room 222-B. RN will call report at [phone]. Per RN [facility] will provide transport and pick patient up at 1 pm. Clinical Social Worker (CSW) sent [facility] [employer]. Patient is aware of above. Patient's sister Doristine CounterBertha is at bedside and aware of above. Please reconsult if future social work needs arise. CSW signing off.   Baker Hughes IncorporatedBailey Jakwon Gayton, LCSW [phone]

## 2018-02-28 NOTE — Progress Notes (Signed)
Report called and given to Molli HazardMatthew at [facility]. [facility] will pick up pt at 1 PM. IV removed. Pt dressed, sister at bedside. Awaiting transfer.

## 2018-02-28 NOTE — Progress Notes (Signed)
PT Cancellation Note  Patient Details Name: Hannah Jackson MRN: [id number] DOB: [birth year]   Cancelled Treatment:    Reason Eval/Treat Not Completed: Patient at procedure or test/unavailable(Consult received and chart reviewed.  Patient currently eating breakfast; requests therapist re-attempt at later time/date.)   [name], PT, DPT, NCS 02/28/18, 10:53 AM [phone]

## 2018-02-28 NOTE — Evaluation (Signed)
Physical Therapy Evaluation Patient Details Name: Hannah HighlandKatie M Jackson MRN: [id number] DOB: Nov 08, 1928 Today's Date: 02/28/2018   History of Present Illness  presented to ER secondary to lethargy, AMS; admitted with acute metabolic encephalopathy secondary to hypoglycemia. Of note, patient with recent admission, [facility]  Clinical Impression  Upon evaluation, patient alert and oriented to basic information; follows simple commands, though requires increased time for processing and task initiation.  Generally weak and deconditioned throughout all extremities (strength at least 3-/5)  Currently requiring mod assist +1-2 for bed mobility; mod assist +2 for sit/stand and standing balance with RW.  Poor balance, standing tolerance; unsafe to attempt stepping at this time. Would benefit from skilled PT to address above deficits and promote optimal return to PLOF; recommend transition to STR upon discharge from acute hospitalization.     Follow Up Recommendations SNF([facility] participant)    Equipment Recommendations  Rolling walker with 5" wheels    Recommendations for Other Services       Precautions / Restrictions Precautions Precautions: Fall Restrictions Weight Bearing Restrictions: No      Mobility  Bed Mobility Overal bed mobility: Needs Assistance Bed Mobility: Supine to Sit;Sit to Supine     Supine to sit: Mod assist Sit to supine: Mod assist;Max assist;+2 for physical assistance      Transfers Overall transfer level: Needs assistance Equipment used: Rolling walker (2 wheeled) Transfers: Sit to/from Stand Sit to Stand: Mod assist;+2 physical assistance         General transfer comment: assist for forward weight shift, lift off from seating surface  Ambulation/Gait             General Gait Details: unsafe/unable at this time due to LE weakness  Stairs            Wheelchair Mobility    Modified Rankin (Stroke Patients Only)        Balance Overall balance assessment: Needs assistance Sitting-balance support: No upper extremity supported;Feet supported Sitting balance-Leahy Scale: Good     Standing balance support: Bilateral upper extremity supported Standing balance-Leahy Scale: Fair                               Pertinent Vitals/Pain Pain Assessment: No/denies pain    Home Living Family/patient expects to be discharged to:: Private residence Living Arrangements: Alone(prior to admission one week ago, family/aides were coordinating 24 hour sup/assist for patient) Available Help at Discharge: Family;Personal care attendant   Home Access: Ramped entrance     Home Layout: One level Home Equipment: Walker - 4 wheels;Wheelchair - manual      Prior Function Level of Independence: Needs assistance         Comments: Assist from family/aide as needed; 4WRW in home, WC in community.  Denies fall history.     Hand Dominance        Extremity/Trunk Assessment   Upper Extremity Assessment Upper Extremity Assessment: Generalized weakness(arthritic limitations bilat shoulders, grossly 3-/5 throughout)    Lower Extremity Assessment Lower Extremity Assessment: Generalized weakness(grossly 3-/5 throughout)       Communication   Communication: No difficulties  Cognition Arousal/Alertness: Awake/alert Behavior During Therapy: WFL for tasks assessed/performed                                   General Comments: delayed processing  General Comments      Exercises Other Exercises Other Exercises: Unsupported sitting edge of bed, close sup; mod assist for upper body dressing (difficulty elevating shoulders), mod/max assist for lower body dressing and donning socks/shoes   Assessment/Plan    PT Assessment Patient needs continued PT services  PT Problem List Decreased strength;Decreased range of motion;Decreased activity tolerance;Decreased balance;Decreased  mobility;Decreased coordination;Decreased cognition;Decreased knowledge of use of DME;Decreased safety awareness;Cardiopulmonary status limiting activity       PT Treatment Interventions DME instruction;Gait training;Stair training;Functional mobility training;Therapeutic activities;Therapeutic exercise;Balance training;Neuromuscular re-education;Cognitive remediation;Patient/family education    PT Goals (Current goals can be found in the Care Plan section)  Acute Rehab PT Goals Patient Stated Goal: to get stronger PT Goal Formulation: With patient Time For Goal Achievement: 03/14/18 Potential to Achieve Goals: Fair    Frequency Min 2X/week   Barriers to discharge        Co-evaluation               AM-PAC PT "6 Clicks" Daily Activity  Outcome Measure Difficulty turning over in bed (including adjusting bedclothes, sheets and blankets)?: Unable Difficulty moving from lying on back to sitting on the side of the bed? : Unable Difficulty sitting down on and standing up from a chair with arms (e.g., wheelchair, bedside commode, etc,.)?: Unable Help needed moving to and from a bed to chair (including a wheelchair)?: A Lot Help needed walking in hospital room?: A Lot Help needed climbing 3-5 steps with a railing? : Total 6 Click Score: 8    End of Session Equipment Utilized During Treatment: Gait belt Activity Tolerance: Patient limited by fatigue Patient left: in bed;with call bell/phone within reach;with nursing/sitter in room;with bed alarm set;with family/visitor present Nurse Communication: Mobility status PT Visit Diagnosis: Muscle weakness (generalized) (M62.81);Difficulty in walking, not elsewhere classified (R26.2)    Time: 4098-1191 PT Time Calculation (min) (ACUTE ONLY): 20 min   Charges:   PT Evaluation $PT Eval Low Complexity: 1 Low PT Treatments $Therapeutic Activity: 8-22 mins   PT G Codes:        Nadav Swindell H. Manson Passey, PT, DPT, NCS 02/28/18, 1:23  PM [phone]

## 2018-02-28 NOTE — Discharge Summary (Signed)
[facility] [place] at [facility], 84 y.o., DOB 03-Apr-1928, MRN [id number]. Admission date: 02/26/2018 Discharge Date 02/28/2018 Primary MD System, Pcp Not In Admitting Physician Cammy Copa, MD  Admission Diagnosis   1.  Acute metabolic encephalopathy 2.  Hypoglycemia 3.  Hypertension 4.  Parkinson's disease 5.  Emphysema  Discharge Diagnosis       1.  Hypoglycemia resolved 2.  Metabolic encephalopathy resolved 3.  Hypertension 4.  Parkinson disease 5.  Emphysema  Hospital Course   84 year old elderly female patient with history of hypertension, Parkinson's disease currently resides at [facility] facility.  She is in the pace program.  She was admitted on 02/26/2018 for hypoglycemia and metabolic encephalopathy.  Patient blood sugar was around 20 when she was brought to the emergency room.  Patient not on any diabetic medications as an outpatient currently.  She was on oral metformin in the past. she was recently treated for COPD and exacerbation in the past.  Patient was given IV D50 in the emergency room twice.  She was started on D5 half normal saline fluids and admitted to the hospital.  Her oral appetite improved.  She maintained blood sugars and IV fluids were discontinued.  During the workup she also had a chest x-ray which showed no pneumonia.  Blood sugar stable.  No evidence of any confusion.  Patient will be discharged to [facility] facility and continue in the pace program.  Advised to eat some snacks in between meals to avoid any hypoglycemia.  Consults  None  Significant Tests:  See full reports for all details    Dg Esophagus  Result Date: 02/24/2018 CLINICAL DATA:  Difficulty swallowing. Dementia, Parkinson's disease. EXAM: ESOPHOGRAM/BARIUM SWALLOW TECHNIQUE: Single contrast examination was performed using thin barium or water soluble. The patient ingested the barium through a straw all in the semi recumbent position. FLUOROSCOPY TIME:   Fluoroscopy Time:  0 minutes, 48 seconds Radiation Exposure Index (if provided by the fluoroscopic device): 24.6 mGy Number of Acquired Spot Images: 3+1 video loop COMPARISON:  None in PACs FINDINGS: The patient ingested the thin barium through a straw without difficulty. No cough was elicited. Positioning was difficult for assessing the hypopharynx and cervical esophagus but no laryngeal or tracheal or bronchial penetration of the barium was detected on subsequent images. The thoracic esophagus was tortuous. There were prominent tertiary contractions. Appropriate gauge in of the primary peristaltic wave was weak with very limited secondary stripping wave activity. There was delayed relaxation of the lower esophageal sphincter but it did ultimately relax but was noted to be narrow in caliber. IMPRESSION: Findings compatible with presbyesophagus. I cannot exclude a fairly long segment distal esophageal stricture. No evidence of aspiration. Direct visualization of the distal esophagus would be useful if the patient can tolerate the procedure. Electronically Signed   By: David  [place]az[place] M.D.   On: 02/24/2018 08:50   Dg Chest Port 1 View  Result Date: 02/27/2018 CLINICAL DATA:  Hypoxia EXAM: PORTABLE CHEST 1 VIEW COMPARISON:  02/19/2018 FINDINGS: Normal heart size and mediastinal contours. Atherosclerotic calcification. There is no edema, consolidation, effusion, or pneumothorax. IMPRESSION: No evidence of active disease. Electronically Signed   By: Marnee Spring M.D.   On: 02/27/2018 09:26   Dg Chest Portable 1 View  Result Date: 02/19/2018 CLINICAL DATA:  Respiratory distress. Cough, lethargy, desaturation. History of asthma and COPD. EXAM: PORTABLE CHEST 1 VIEW COMPARISON:  02/06/2016 FINDINGS: Shallow inspiration with linear atelectasis in the lung bases. Heart  size and pulmonary vascularity are normal for technique. No airspace disease or consolidation in the lungs. No blunting of costophrenic angles. No  pneumothorax. IMPRESSION: Shallow inspiration.  No evidence of active pulmonary disease. Electronically Signed   By: Burman Nieves M.D.   On: 02/19/2018 23:32       Today   Subjective:   Javona Bergevin is a elderly 84 year old female patient in no acute distress Alert and awake and responds to all verbal commands No chest pain No shortness of breath No fever and chills  Objective:   Blood pressure (!) 155/44, pulse 62, temperature 98.3 F (36.8 C), temperature source Oral, resp. rate 17, height 5\' 4"  (1.626 m), weight 94.9 kg (209 lb 3.5 oz), SpO2 99 %.  .  Intake/Output Summary (Last 24 hours) at 02/28/2018 1209 Last data filed at 02/28/2018 0750 Gross per 24 hour  Intake 1416.67 ml  Output 2100 ml  Net -683.33 ml    Exam VITAL SIGNS: Blood pressure (!) 155/44, pulse 62, temperature 98.3 F (36.8 C), temperature source Oral, resp. rate 17, height 5\' 4"  (1.626 m), weight 94.9 kg (209 lb 3.5 oz), SpO2 99 %.  GENERAL:  84 y.o.-year-old patient lying in the bed with no acute distress.  EYES: Pupils equal, round, reactive to light and accommodation. No scleral icterus. Extraocular muscles intact.  HEENT: Head atraumatic, normocephalic. Oropharynx and nasopharynx clear.  NECK:  Supple, no jugular venous distention. No thyroid enlargement, no tenderness.  LUNGS: Normal breath sounds bilaterally, no wheezing, rales,rhonchi or crepitation. No use of accessory muscles of respiration.  CARDIOVASCULAR: S1, S2 normal. No murmurs, rubs, or gallops.  ABDOMEN: Soft, nontender, nondistended. Bowel sounds present. No organomegaly or mass.  EXTREMITIES: No pedal edema, cyanosis, or clubbing.  NEUROLOGIC: Cranial nerves II through XII are intact. Muscle strength 5/5 in all extremities. Sensation intact. Gait not checked.  PSYCHIATRIC: The patient is alert and oriented x 3.  SKIN: No obvious rash, lesion, or ulcer.   Data Review     CBC w Diff:  Lab Results  Component Value Date   WBC  13.4 (H) 02/27/2018   HGB 12.5 02/27/2018   HCT 39.2 02/27/2018   PLT 190 02/27/2018   LYMPHOPCT 10 02/26/2018   MONOPCT 7 02/26/2018   EOSPCT 2 02/26/2018   BASOPCT 0 02/26/2018   CMP:  Lab Results  Component Value Date   NA 135 02/27/2018   K 4.1 02/27/2018   CL 102 02/27/2018   CO2 29 02/27/2018   BUN 24 (H) 02/27/2018   CREATININE 0.62 02/27/2018   PROT 6.4 (L) 02/26/2018   ALBUMIN 3.6 02/26/2018   BILITOT 0.7 02/26/2018   ALKPHOS 54 02/26/2018   AST 18 02/26/2018   ALT 5 (L) 02/26/2018  .  Micro Results Recent Results (from the past 240 hour(s))  Urine Culture     Status: None   Collection Time: 02/19/18 10:26 PM  Result Value Ref Range Status   Specimen Description   Final    URINE, RANDOM Performed at [facility], [address] [place], [place] [address]    Special Requests   Final    Normal Performed at [facility], [address], [place], [place] [address]    Culture   Final    NO GROWTH Performed at [facility], [address], [place], [place] [address]    Report Status 02/21/2018 FINAL  Final  Blood culture (routine x 2)     Status: None  Collection Time: 02/19/18 10:27 PM  Result Value Ref Range Status   Specimen Description BLOOD RIGHT HAND  Final   Special Requests   Final    BOTTLES DRAWN AEROBIC AND ANAEROBIC Blood Culture adequate volume   Culture   Final    NO GROWTH 5 DAYS Performed at [facility], [address] [place], [place] [address]    Report Status 02/24/2018 FINAL  Final  Blood culture (routine x 2)     Status: Abnormal   Collection Time: 02/19/18 10:27 PM  Result Value Ref Range Status   Specimen Description   Final    BLOOD LEFT HAND Performed at [facility], [address] [place], [place] [address]    Special Requests   Final    BOTTLES DRAWN AEROBIC AND ANAEROBIC Blood Culture results may not be optimal due to an excessive volume of blood received in culture  bottles Performed at [facility], [address] [place], [place] [address]    Culture  Setup Time   Final    GRAM POSITIVE COCCI IN BOTH AEROBIC AND ANAEROBIC BOTTLES CRITICAL RESULT CALLED TO, READ BACK BY AND VERIFIED WITH: MATT MCBANE  ON 02/21/18 AT 0200 JAG    Culture (A)  Final    STAPHYLOCOCCUS SPECIES (COAGULASE NEGATIVE) THE SIGNIFICANCE OF ISOLATING THIS ORGANISM FROM A SINGLE SET OF BLOOD CULTURES WHEN MULTIPLE SETS ARE DRAWN IS UNCERTAIN. PLEASE NOTIFY THE MICROBIOLOGY DEPARTMENT WITHIN ONE WEEK IF SPECIATION AND SENSITIVITIES ARE REQUIRED. Performed at [facility], [address] [place], [place] [address]    Report Status 02/23/2018 FINAL  Final  Blood Culture ID Panel (Reflexed)     Status: Abnormal   Collection Time: 02/19/18 10:27 PM  Result Value Ref Range Status   Enterococcus species NOT DETECTED NOT DETECTED Final   Listeria monocytogenes NOT DETECTED NOT DETECTED Final   Staphylococcus species DETECTED (A) NOT DETECTED Final    Comment: Methicillin (oxacillin) susceptible coagulase negative staphylococcus. Possible blood culture contaminant (unless isolated from more than one blood culture draw or clinical case suggests pathogenicity). No antibiotic treatment is indicated for blood  culture contaminants. CRITICAL RESULT CALLED TO, READ BACK BY AND VERIFIED WITH: MATT MCBANE ON 02/21/18 AT 0200 JAG    Staphylococcus aureus NOT DETECTED NOT DETECTED Final   Methicillin resistance NOT DETECTED NOT DETECTED Final   Streptococcus species NOT DETECTED NOT DETECTED Final   Streptococcus agalactiae NOT DETECTED NOT DETECTED Final   Streptococcus pneumoniae NOT DETECTED NOT DETECTED Final   Streptococcus pyogenes NOT DETECTED NOT DETECTED Final   Acinetobacter baumannii NOT DETECTED NOT DETECTED Final   Enterobacteriaceae species NOT DETECTED NOT DETECTED Final   Enterobacter cloacae complex NOT DETECTED NOT DETECTED Final   Escherichia coli NOT  DETECTED NOT DETECTED Final   Klebsiella oxytoca NOT DETECTED NOT DETECTED Final   Klebsiella pneumoniae NOT DETECTED NOT DETECTED Final   Proteus species NOT DETECTED NOT DETECTED Final   Serratia marcescens NOT DETECTED NOT DETECTED Final   Haemophilus influenzae NOT DETECTED NOT DETECTED Final   Neisseria meningitidis NOT DETECTED NOT DETECTED Final   Pseudomonas aeruginosa NOT DETECTED NOT DETECTED Final   Candida albicans NOT DETECTED NOT DETECTED Final   Candida glabrata NOT DETECTED NOT DETECTED Final   Candida krusei NOT DETECTED NOT DETECTED Final   Candida parapsilosis NOT DETECTED NOT DETECTED Final   Candida tropicalis NOT DETECTED NOT DETECTED Final    Comment: Performed at [facility], [address] [place], [place] [address]  MRSA PCR Screening     Status: None   Collection Time: 02/20/18  3:47 AM  Result Value Ref Range Status   MRSA by PCR NEGATIVE NEGATIVE Final    Comment:        The GeneXpert MRSA Assay (FDA approved for NASAL specimens only), is one component of a comprehensive MRSA colonization surveillance program. It is not intended to diagnose MRSA infection nor to guide or monitor treatment for MRSA infections. Performed at [facility], [address], [place], [place] [address]         Code Status Orders  (From admission, onward)        Start     Ordered   02/27/18 0415  Full code  Continuous     02/27/18 0414    Code Status History    Date Active Date Inactive Code Status Order ID Comments User Context   02/20/2018 0336 02/24/2018 1727 Full Code [id number]  Oralia Manis, MD Inpatient          Contact information for after-discharge care    Destination    [facility] Follow up in 1 week(s).   Service:  Skilled Nursing Why:  f/u with [facility] at facility Contact information: [address] [place] [address] [place] [address] [phone]              Discharge Medications    Allergies as of 02/28/2018      Reactions   Penicillins    Has patient had a PCN reaction causing immediate rash, facial/tongue/throat swelling, SOB or lightheadedness with hypotension: Unknown Has patient had a PCN reaction causing severe rash involving mucus membranes or skin necrosis: Unknown Has patient had a PCN reaction that required hospitalization: Unknown Has patient had a PCN reaction occurring within the last 10 years: Unknown If all of the above answers are "NO", then may proceed with Cephalosporin use.      Medication List    TAKE these medications   acetaminophen 650 MG CR tablet Commonly known as:  TYLENOL Take 650 mg by mouth 2 (two) times daily. For arthritis pain/stiffness   albuterol (2.5 MG/3ML) 0.083% nebulizer solution Commonly known as:  PROVENTIL Take 2.5 mg by nebulization every 6 (six) hours as needed for wheezing or shortness of breath.   aspirin EC 81 MG tablet Take 81 mg by mouth daily.   AYR 0.65 % nasal spray Generic drug:  sodium chloride Place 2 sprays into the nose. 2 sprays in each nostril 3-4 times daily   BAZA PROTECT EX Apply 1 application topically 3 (three) times daily as needed (wound).   budesonide 0.25 MG/2ML nebulizer solution Commonly known as:  PULMICORT Take 2 mLs (0.25 mg total) by nebulization 2 (two) times daily.   calcium carbonate 500 MG chewable tablet Commonly known as:  TUMS - dosed in mg elemental calcium Chew 1-2 tablets by mouth 3 (three) times daily as needed for indigestion or heartburn.   carbidopa-levodopa 25-100 MG tablet Commonly known as:  SINEMET IR Take 1 tablet by mouth 3 (three) times daily.   SINEMET CR 50-200 MG tablet Generic drug:  carbidopa-levodopa Take 1 tablet by mouth 2 (two) times daily. In the morning and at noon   cholecalciferol 1000 units tablet Commonly known as:  VITAMIN D Take 1,000 Units by mouth daily.   diclofenac sodium 1 % Gel Commonly known as:  VOLTAREN Apply 2 g  topically 3 (three) times daily as needed (for wrist  pain).   docusate sodium 100 MG capsule Commonly known as:  COLACE Take 100 mg by mouth daily as needed for mild constipation.   glucose 4 GM chewable tablet Chew 1 tablet (4 g total) by mouth 2 (two) times daily.   guaiFENesin 600 MG 12 hr tablet Commonly known as:  MUCINEX Take 600 mg by mouth 2 (two) times daily as needed for to loosen phlegm.   guaiFENesin-dextromethorphan 100-10 MG/5ML syrup Commonly known as:  ROBITUSSIN DM Take 5 mLs by mouth every 4 (four) hours as needed for cough.   ibandronate 150 MG tablet Commonly known as:  BONIVA Take 150 mg by mouth every 30 (thirty) days. Take in the morning with a full glass of water, on an empty stomach, and do not take anything else by mouth or lie down for the next 60 min.   lactose free nutrition Liqd Take 237 mLs by mouth 3 (three) times daily with meals.   lidocaine 5 % Commonly known as:  LIDODERM Place 1 patch onto the skin daily. Remove & Discard patch within 12 hours   lisinopril 5 MG tablet Commonly known as:  PRINIVIL,ZESTRIL Take 5 mg by mouth daily.   MYRBETRIQ 25 MG Tb24 tablet Generic drug:  mirabegron ER Take 25 mg by mouth daily.   OPTIVE 0.5-0.9 % ophthalmic solution Generic drug:  carboxymethylcellul-glycerin Place 1-2 drops into both eyes 4 (four) times daily as needed for dry eyes.   polyethylene glycol packet Commonly known as:  MIRALAX / GLYCOLAX Take 17 g by mouth daily as needed.   pregabalin 75 MG capsule Commonly known as:  LYRICA Take 75 mg by mouth 2 (two) times daily.   ranitidine 150 MG tablet Commonly known as:  ZANTAC Take 150 mg by mouth 2 (two) times daily.   senna-docusate 8.6-50 MG tablet Commonly known as:  Senokot-S Take 1 tablet by mouth daily.   Zinc Oxide 13 % Crea Apply 1 application topically as needed for irritation (apply cream to buttocks three times daily and as needed as a barrier cream).           Total Time in preparing paper work, data evaluation and todays exam - 35 minutes  Ihor Austin M.D on 02/28/2018 at 12:09 PM [facility]   Office  [phone]

## 2018-03-04 LAB — GLUCOSE, CAPILLARY: Glucose-Capillary: 152 mg/dL — ABNORMAL HIGH (ref 65–99)

## 2019-02-01 ENCOUNTER — Emergency Department: Payer: Medicare (Managed Care)

## 2019-02-01 ENCOUNTER — Inpatient Hospital Stay: Payer: Medicare (Managed Care)

## 2019-02-01 ENCOUNTER — Inpatient Hospital Stay
Admission: EM | Admit: 2019-02-01 | Discharge: 2019-02-04 | DRG: 304 | Disposition: A | Discharge status: Home Health | Payer: Medicare (Managed Care) | Attending: Internal Medicine | Admitting: Internal Medicine

## 2019-02-01 ENCOUNTER — Other Ambulatory Visit: Payer: Self-pay

## 2019-02-01 DIAGNOSIS — Z880 Allergy status to penicillin: Secondary | ICD-10-CM | POA: Diagnosis not present

## 2019-02-01 DIAGNOSIS — G9341 Metabolic encephalopathy: Secondary | ICD-10-CM | POA: Diagnosis present

## 2019-02-01 DIAGNOSIS — K219 Gastro-esophageal reflux disease without esophagitis: Secondary | ICD-10-CM | POA: Diagnosis present

## 2019-02-01 DIAGNOSIS — F0280 Dementia in other diseases classified elsewhere without behavioral disturbance: Secondary | ICD-10-CM | POA: Diagnosis present

## 2019-02-01 DIAGNOSIS — G20 Parkinson's disease: Secondary | ICD-10-CM | POA: Diagnosis present

## 2019-02-01 DIAGNOSIS — W19XXXA Unspecified fall, initial encounter: Secondary | ICD-10-CM | POA: Diagnosis present

## 2019-02-01 DIAGNOSIS — I10 Essential (primary) hypertension: Secondary | ICD-10-CM | POA: Diagnosis present

## 2019-02-01 DIAGNOSIS — E785 Hyperlipidemia, unspecified: Secondary | ICD-10-CM | POA: Diagnosis present

## 2019-02-01 DIAGNOSIS — Z7982 Long term (current) use of aspirin: Secondary | ICD-10-CM | POA: Diagnosis not present

## 2019-02-01 DIAGNOSIS — R4182 Altered mental status, unspecified: Secondary | ICD-10-CM | POA: Diagnosis not present

## 2019-02-01 DIAGNOSIS — Z833 Family history of diabetes mellitus: Secondary | ICD-10-CM

## 2019-02-01 DIAGNOSIS — E1151 Type 2 diabetes mellitus with diabetic peripheral angiopathy without gangrene: Secondary | ICD-10-CM | POA: Diagnosis present

## 2019-02-01 DIAGNOSIS — I6381 Other cerebral infarction due to occlusion or stenosis of small artery: Secondary | ICD-10-CM | POA: Diagnosis present

## 2019-02-01 DIAGNOSIS — M545 Low back pain: Secondary | ICD-10-CM | POA: Diagnosis present

## 2019-02-01 DIAGNOSIS — Z79899 Other long term (current) drug therapy: Secondary | ICD-10-CM | POA: Diagnosis not present

## 2019-02-01 DIAGNOSIS — Z8249 Family history of ischemic heart disease and other diseases of the circulatory system: Secondary | ICD-10-CM

## 2019-02-01 DIAGNOSIS — Z806 Family history of leukemia: Secondary | ICD-10-CM

## 2019-02-01 DIAGNOSIS — R8290 Unspecified abnormal findings in urine: Secondary | ICD-10-CM | POA: Diagnosis present

## 2019-02-01 DIAGNOSIS — I160 Hypertensive urgency: Principal | ICD-10-CM | POA: Diagnosis present

## 2019-02-01 DIAGNOSIS — G8929 Other chronic pain: Secondary | ICD-10-CM | POA: Diagnosis present

## 2019-02-01 DIAGNOSIS — S0993XA Unspecified injury of face, initial encounter: Secondary | ICD-10-CM | POA: Diagnosis present

## 2019-02-01 DIAGNOSIS — Z66 Do not resuscitate: Secondary | ICD-10-CM | POA: Diagnosis present

## 2019-02-01 DIAGNOSIS — J45909 Unspecified asthma, uncomplicated: Secondary | ICD-10-CM | POA: Diagnosis present

## 2019-02-01 DIAGNOSIS — Z823 Family history of stroke: Secondary | ICD-10-CM

## 2019-02-01 DIAGNOSIS — Z7951 Long term (current) use of inhaled steroids: Secondary | ICD-10-CM | POA: Diagnosis not present

## 2019-02-01 LAB — PROTIME-INR
INR: 1 (ref 0.8–1.2)
PROTHROMBIN TIME: 13.2 s (ref 11.4–15.2)

## 2019-02-01 LAB — COMPREHENSIVE METABOLIC PANEL
ALBUMIN: 4.9 g/dL (ref 3.5–5.0)
ALK PHOS: 54 U/L (ref 38–126)
ALT: 9 U/L (ref 0–44)
ANION GAP: 10 (ref 5–15)
AST: 18 U/L (ref 15–41)
BUN: 23 mg/dL (ref 8–23)
CALCIUM: 8.6 mg/dL — AB (ref 8.9–10.3)
CO2: 25 mmol/L (ref 22–32)
Chloride: 103 mmol/L (ref 98–111)
Creatinine, Ser: 0.72 mg/dL (ref 0.44–1.00)
GFR calc Af Amer: >60 mL/min (ref >60)
GFR calc non Af Amer: >60 mL/min (ref >60)
GLUCOSE: 184 mg/dL — AB (ref 70–99)
Potassium: 4.4 mmol/L (ref 3.5–5.1)
SODIUM: 138 mmol/L (ref 135–145)
Total Bilirubin: 1.1 mg/dL (ref 0.3–1.2)
Total Protein: 7.4 g/dL (ref 6.5–8.1)

## 2019-02-01 LAB — CBC WITH DIFFERENTIAL/PLATELET
ABS IMMATURE GRANULOCYTES: 0.07 10*3/uL (ref 0.00–0.07)
BASOS ABS: 0 10*3/uL (ref 0.0–0.1)
Basophils Relative: 0 %
Eosinophils Absolute: 0 10*3/uL (ref 0.0–0.5)
Eosinophils Relative: 0 %
HCT: 42.8 % (ref 36.0–46.0)
HEMOGLOBIN: 13.4 g/dL (ref 12.0–15.0)
Immature Granulocytes: 1 %
LYMPHS ABS: 0.3 10*3/uL — AB (ref 0.7–4.0)
LYMPHS PCT: 2 %
MCH: 28.5 pg (ref 26.0–34.0)
MCHC: 31.3 g/dL (ref 30.0–36.0)
MCV: 91.1 fL (ref 80.0–100.0)
Monocytes Absolute: 0.4 10*3/uL (ref 0.1–1.0)
Monocytes Relative: 2 %
NEUTROS ABS: 14 10*3/uL — AB (ref 1.7–7.7)
NRBC: 0 % (ref 0.0–0.2)
Neutrophils Relative %: 95 %
Platelets: 214 10*3/uL (ref 150–400)
RBC: 4.7 MIL/uL (ref 3.87–5.11)
RDW: 12.5 % (ref 11.5–15.5)
WBC: 14.8 10*3/uL — ABNORMAL HIGH (ref 4.0–10.5)

## 2019-02-01 LAB — CK: Total CK: 341 U/L — ABNORMAL HIGH (ref 38–234)

## 2019-02-01 LAB — URINALYSIS, COMPLETE (UACMP) WITH MICROSCOPIC
Bilirubin Urine: NEGATIVE
GLUCOSE, UA: NEGATIVE mg/dL
Ketones, ur: 20 mg/dL — AB
NITRITE: NEGATIVE
PH: 5 (ref 5.0–8.0)
Protein, ur: 30 mg/dL — AB
SPECIFIC GRAVITY, URINE: 1.019 (ref 1.005–1.030)

## 2019-02-01 LAB — LACTIC ACID, PLASMA
Lactic Acid, Venous: 1.5 mmol/L (ref 0.5–1.9)
Lactic Acid, Venous: 1.2 mmol/L (ref 0.5–1.9)

## 2019-02-01 LAB — GLUCOSE, CAPILLARY
Glucose-Capillary: 135 mg/dL — ABNORMAL HIGH (ref 70–99)
Glucose-Capillary: 160 mg/dL — ABNORMAL HIGH (ref 70–99)

## 2019-02-01 MED ORDER — PREGABALIN 75 MG PO CAPS
75.0000 mg | ORAL_CAPSULE | Freq: Every day | ORAL | Status: DC
  Administered 2019-02-01 – 2019-02-03 (×3): 75 mg via ORAL
  Filled 2019-02-01 (×3): qty 1

## 2019-02-01 MED ORDER — LABETALOL HCL 5 MG/ML IV SOLN: 10.0000 mg | Freq: Once | INTRAVENOUS | Status: DC

## 2019-02-01 MED ORDER — ENOXAPARIN SODIUM 40 MG/0.4ML ~~LOC~~ SOLN
40.0000 mg | SUBCUTANEOUS | Status: DC
  Administered 2019-02-01 – 2019-02-03 (×3): 40 mg via SUBCUTANEOUS
  Filled 2019-02-01 (×3): qty 0.4

## 2019-02-01 MED ORDER — BOOST PLUS PO LIQD
237.0000 mL | Freq: Three times a day (TID) | ORAL | Status: DC
  Administered 2019-02-02 – 2019-02-04 (×6): 237 mL via ORAL
  Filled 2019-02-01: qty 237

## 2019-02-01 MED ORDER — INSULIN ASPART 100 UNIT/ML ~~LOC~~ SOLN
0.0000 [IU] | Freq: Three times a day (TID) | SUBCUTANEOUS | Status: DC
  Administered 2019-02-01: 1 [IU] via SUBCUTANEOUS
  Administered 2019-02-03: 2 [IU] via SUBCUTANEOUS
  Administered 2019-02-04: 1 [IU] via SUBCUTANEOUS
  Filled 2019-02-01 (×3): qty 1

## 2019-02-01 MED ORDER — DOCUSATE SODIUM 100 MG PO CAPS
100.0000 mg | ORAL_CAPSULE | Freq: Two times a day (BID) | ORAL | Status: DC
  Administered 2019-02-01 – 2019-02-04 (×6): 100 mg via ORAL
  Filled 2019-02-01 (×6): qty 1

## 2019-02-01 MED ORDER — SODIUM CHLORIDE 0.9 % IV BOLUS
1000.0000 mL | Freq: Once | INTRAVENOUS | Status: AC
  Administered 2019-02-01: 1000 mL via INTRAVENOUS

## 2019-02-01 MED ORDER — BUDESONIDE 0.25 MG/2ML IN SUSP
0.2500 mg | Freq: Two times a day (BID) | RESPIRATORY_TRACT | Status: DC
  Administered 2019-02-01 – 2019-02-02 (×2): 0.25 mg via RESPIRATORY_TRACT
  Filled 2019-02-01 (×2): qty 2

## 2019-02-01 MED ORDER — METOPROLOL TARTRATE 5 MG/5ML IV SOLN
5.0000 mg | INTRAVENOUS | Status: DC | PRN
  Administered 2019-02-01: 5 mg via INTRAVENOUS
  Filled 2019-02-01: qty 5

## 2019-02-01 MED ORDER — ASPIRIN EC 81 MG PO TBEC
81.0000 mg | DELAYED_RELEASE_TABLET | Freq: Every day | ORAL | Status: DC
  Administered 2019-02-01 – 2019-02-04 (×4): 81 mg via ORAL
  Filled 2019-02-01 (×4): qty 1

## 2019-02-01 MED ORDER — ZINC OXIDE 11.3 % EX CREA
1.0000 "application " | TOPICAL_CREAM | CUTANEOUS | Status: DC | PRN
  Filled 2019-02-01: qty 56

## 2019-02-01 MED ORDER — ACETAMINOPHEN 325 MG PO TABS
650.0000 mg | ORAL_TABLET | Freq: Four times a day (QID) | ORAL | Status: DC | PRN
  Administered 2019-02-02 – 2019-02-03 (×4): 650 mg via ORAL
  Filled 2019-02-01 (×4): qty 2

## 2019-02-01 MED ORDER — MIRABEGRON ER 25 MG PO TB24
25.0000 mg | ORAL_TABLET | Freq: Every day | ORAL | Status: DC
  Administered 2019-02-02 – 2019-02-04 (×3): 25 mg via ORAL
  Filled 2019-02-01 (×4): qty 1

## 2019-02-01 MED ORDER — BISACODYL 5 MG PO TBEC: 5.0000 mg | DELAYED_RELEASE_TABLET | Freq: Every day | ORAL | Status: DC | PRN

## 2019-02-01 MED ORDER — ONDANSETRON HCL 4 MG/2ML IJ SOLN: 4.0000 mg | Freq: Four times a day (QID) | INTRAMUSCULAR | Status: DC | PRN

## 2019-02-01 MED ORDER — LISINOPRIL 5 MG PO TABS
5.0000 mg | ORAL_TABLET | Freq: Every day | ORAL | Status: DC
  Administered 2019-02-01 – 2019-02-03 (×3): 5 mg via ORAL
  Filled 2019-02-01 (×3): qty 1

## 2019-02-01 MED ORDER — ACETAMINOPHEN 650 MG RE SUPP: 650.0000 mg | Freq: Four times a day (QID) | RECTAL | Status: DC | PRN

## 2019-02-01 MED ORDER — HYDRALAZINE HCL 25 MG PO TABS
25.0000 mg | ORAL_TABLET | Freq: Three times a day (TID) | ORAL | Status: DC
  Administered 2019-02-01 – 2019-02-03 (×6): 25 mg via ORAL
  Filled 2019-02-01 (×6): qty 1

## 2019-02-01 MED ORDER — SODIUM CHLORIDE 0.9% FLUSH
3.0000 mL | Freq: Two times a day (BID) | INTRAVENOUS | Status: DC
  Administered 2019-02-01 – 2019-02-04 (×5): 3 mL via INTRAVENOUS

## 2019-02-01 MED ORDER — CIPROFLOXACIN IN D5W 200 MG/100ML IV SOLN
200.0000 mg | Freq: Two times a day (BID) | INTRAVENOUS | Status: DC
  Administered 2019-02-01 – 2019-02-03 (×4): 200 mg via INTRAVENOUS
  Filled 2019-02-01 (×5): qty 100

## 2019-02-01 MED ORDER — ONDANSETRON HCL 4 MG PO TABS: 4.0000 mg | ORAL_TABLET | Freq: Four times a day (QID) | ORAL | Status: DC | PRN

## 2019-02-01 MED ORDER — CARBIDOPA-LEVODOPA ER 50-200 MG PO TBCR
1.0000 | EXTENDED_RELEASE_TABLET | Freq: Three times a day (TID) | ORAL | Status: DC
  Administered 2019-02-02 – 2019-02-04 (×7): 1 via ORAL
  Filled 2019-02-01 (×13): qty 1

## 2019-02-01 NOTE — ED Notes (Signed)
Patient taken off non rebreather and placed on 6L O2 to see if she can tolerate

## 2019-02-01 NOTE — ED Triage Notes (Signed)
Pt arrives [facility] with non re breather on. Audible wheezing heard. Family told EMS that pt is dementia, DM, parkinsons, lives by self. Arrives with DNR form. Family found pt on floor this AM. Arrives with dried blood noted to L side of face, possible from her mouth. No active breathing.   EMS states initial sats were in 50's, placed on non re breather, EMS stats oxygen levels came up. At 15 L upon arrival. Called RT to place pt on bipap.

## 2019-02-01 NOTE — H&P (Signed)
[facility] - [place] at [facility]   PATIENT NAME: Hannah Jackson    MR#:  [id number]  DATE OF BIRTH:  [birth year]  DATE OF ADMISSION:  02/01/2019  PRIMARY CARE PHYSICIAN: System, Pcp Not In   REQUESTING/REFERRING PHYSICIAN: Dr. Minna Antis  CHIEF COMPLAINT: Altered mental status   Chief Complaint  Patient presents with  . Fall  . Respiratory Distress    HISTORY OF PRESENT ILLNESS:  Hannah Jackson  is a 85 y.o. female with a known history of baseline dementia, Parkinson disease, hypertension, diabetes mellitus type 2, who lives alone found by family on the floor, have decreased responsiveness.  When EMS arrived, she was having 50% however they checked pulse ox with the earlobe good waveform and sats 100% on nonrebreather as per documentation.  Patient did not have any hypoglycemia, when EMS arrived patient found to have some dried blood in her mouth and around her face.  Patient sister mentioned that she did not have any seizure disorder.  And according to sister patient is confused at times.  But this morning found on the floor not sure whether she had seizure or hypoglycemia and passed out.  Patient found to have severely elevated blood pressure 203 / 80 in the emergency room.  Patient able to answer my questions when I entered the room but easily drifting back to sleep.  PAST MEDICAL HISTORY:   Past Medical History:  Diagnosis Date  . Asthma   . Diabetes mellitus without complication (HCC)    on metformin  . HLD (hyperlipidemia)   . HTN (hypertension)   . Parkinson's disease (HCC)     PAST SURGICAL HISTOIRY:   Past Surgical History:  Procedure Laterality Date  . BACK SURGERY      SOCIAL HISTORY:   Social History   Tobacco Use  . Smoking status: Never Smoker  . Smokeless tobacco: Never Used  Substance Use Topics  . Alcohol use: No    FAMILY HISTORY:   Family History  Problem Relation Age of Onset  . Leukemia Mother   . Stroke Mother    . CAD Father   . Hypertension Father   . Heart attack Father     DRUG ALLERGIES:   Allergies  Allergen Reactions  . Penicillins     Has patient had a PCN reaction causing immediate rash, facial/tongue/throat swelling, SOB or lightheadedness with hypotension: Unknown Has patient had a PCN reaction causing severe rash involving mucus membranes or skin necrosis: Unknown Has patient had a PCN reaction that required hospitalization: Unknown Has patient had a PCN reaction occurring within the last 10 years: Unknown If all of the above answers are "NO", then may proceed with Cephalosporin use.     REVIEW OF SYSTEMS:  CONSTITUTIONAL: No fever, fatigue or weakness.  EYES: No blurred or double vision.  EARS, NOSE, AND THROAT: No tinnitus or ear pain.  RESPIRATORY: No cough, shortness of breath, wheezing or hemoptysis.  CARDIOVASCULAR: No chest pain, orthopnea, edema.  GASTROINTESTINAL: No nausea, vomiting, diarrhea or abdominal pain.  GENITOURINARY: No dysuria, hematuria.  ENDOCRINE: No polyuria, nocturia,  HEMATOLOGY: No anemia, easy bruising or bleeding SKIN: No rash or lesion. MUSCULOSKELETAL: Complains of lower back pain.Marland Kitchen NEUROLOGIC: No tingling, numbness, weakness.  PSYCHIATRY: No anxiety or depression.   MEDICATIONS AT HOME:   Prior to Admission medications   Medication Sig Start Date End Date Taking? Authorizing Provider  acetaminophen (TYLENOL) 650 MG CR tablet Take 650 mg by mouth 2 (two) times daily.  For arthritis pain/stiffness   Yes [provider]  albuterol (PROVENTIL) (2.5 MG/3ML) 0.083% nebulizer solution Take 2.5 mg by nebulization every 6 (six) hours as needed for wheezing or shortness of breath.   Yes [provider]  aspirin EC 81 MG tablet Take 81 mg by mouth daily.   Yes [provider]  benzonatate (TESSALON) 100 MG capsule Take 100 mg by mouth 3 (three) times daily as needed for cough.   Yes [provider]  budesonide  (PULMICORT) 0.5 MG/2ML nebulizer solution Take 0.5 mg by nebulization 2 (two) times daily.   Yes [provider]  carbidopa-levodopa (SINEMET CR) 50-200 MG tablet Take 1 tablet by mouth 3 (three) times daily.    Yes [provider]  carbidopa-levodopa (SINEMET IR) 25-100 MG tablet Take 1 tablet by mouth 3 (three) times daily.   Yes [provider]  carboxymethylcellul-glycerin (OPTIVE) 0.5-0.9 % ophthalmic solution Place 1-2 drops into both eyes 4 (four) times daily as needed for dry eyes.   Yes [provider]  cholecalciferol (VITAMIN D) 1000 units tablet Take 1,000 Units by mouth daily.   Yes [provider]  diclofenac sodium (VOLTAREN) 1 % GEL Apply 2 g topically 3 (three) times daily as needed (for wrist pain).   Yes [provider]  glucose 4 GM chewable tablet Chew 1 tablet (4 g total) by mouth 2 (two) times daily. 02/24/18  Yes Auburn Bilberry, MD  guaiFENesin (MUCINEX) 600 MG 12 hr tablet Take 600 mg by mouth 2 (two) times daily as needed for to loosen phlegm.    Yes [provider]  guaiFENesin-dextromethorphan (ROBITUSSIN DM) 100-10 MG/5ML syrup Take 5 mLs by mouth every 4 (four) hours as needed for cough. 02/24/18  Yes Auburn Bilberry, MD  ibandronate (BONIVA) 150 MG tablet Take 150 mg by mouth every 30 (thirty) days. Take in the morning with a full glass of water, on an empty stomach, and do not take anything else by mouth or lie down for the next 60 min.   Yes [provider]  lactose free nutrition (BOOST PLUS) LIQD Take 237 mLs by mouth 3 (three) times daily with meals. 02/24/18  Yes Auburn Bilberry, MD  lisinopril (PRINIVIL,ZESTRIL) 5 MG tablet Take 5 mg by mouth daily.   Yes [provider]  mirabegron ER (MYRBETRIQ) 25 MG TB24 tablet Take 25 mg by mouth daily.   Yes [provider]  pregabalin (LYRICA) 75 MG capsule Take 75 mg by mouth at bedtime.    Yes [provider]  ranitidine (ZANTAC)  150 MG tablet Take 150 mg by mouth 2 (two) times daily.   Yes [provider]  senna-docusate (SENOKOT-S) 8.6-50 MG tablet Take 1 tablet by mouth daily.   Yes [provider]  Zinc Oxide 13 % CREA Apply 1 application topically as needed for irritation (apply cream to buttocks three times daily and as needed as a barrier cream).   Yes [provider]  budesonide (PULMICORT) 0.25 MG/2ML nebulizer solution Take 2 mLs (0.25 mg total) by nebulization 2 (two) times daily. Patient not taking: Reported on 02/01/2019 02/24/18   Auburn Bilberry, MD      VITAL SIGNS:  Blood pressure (!) 165/98, pulse 95, temperature 98.7 F (37.1 C), temperature source Oral, resp. rate 16, height  (1.702 m), weight 93.3 kg, SpO2 95 %.  PHYSICAL EXAMINATION:  GENERAL:  85 y.o.-year-old patient lying in the bed with no acute distress.  EYES: Pupils equal, round,  reactive to light and accommodation. No scleral icterus. Extraocular muscles intact.  HEENT: Head atraumatic, normocephalic. Oropharynx and nasopharynx clear.  NECK:  Supple, no jugular venous distention. No thyroid enlargement, no tenderness.  LUNGS: Normal breath sounds bilaterally, no wheezing, rales,rhonchi or crepitation. No use of accessory muscles of respiration.  CARDIOVASCULAR: S1, S2 normal. No murmurs, rubs, or gallops.  ABDOMEN: Soft, nontender, nondistended. Bowel sounds present. No organomegaly or mass.  EXTREMITIES: No pedal edema, cyanosis, or clubbing.  NEUROLOGIC: Cranial nerves II through XII are intact. Muscle strength 5/5 in all extremities. Sensation intact. Gait not checked.  PSYCHIATRIC: The patient is alert and oriented x 3.  SKIN: No obvious rash, lesion, or ulcer.   LABORATORY PANEL:   CBC Recent Labs  Lab 02/01/19 1038  WBC 14.8*  HGB 13.4  HCT 42.8  PLT 214   ------------------------------------------------------------------------------------------------------------------  Chemistries  Recent  Labs  Lab 02/01/19 1038  NA 138  K 4.4  CL 103  CO2 25  GLUCOSE 184*  BUN 23  CREATININE 0.72  CALCIUM 8.6*  AST 18  ALT 9  ALKPHOS 54  BILITOT 1.1   ------------------------------------------------------------------------------------------------------------------  Cardiac Enzymes No results for input(s): TROPONINI in the last 168 hours. ------------------------------------------------------------------------------------------------------------------  RADIOLOGY:  Ct Head Wo Contrast  Result Date: 02/01/2019 CLINICAL DATA:  Dementia. Parkinson's disease. Found on the floor today. EXAM: CT HEAD WITHOUT CONTRAST TECHNIQUE: Contiguous axial images were obtained from the base of the skull through the vertex without intravenous contrast. COMPARISON:  09/16/2015 FINDINGS: Brain: Generalized atrophy. No sign of acute infarction, mass lesion, hemorrhage, hydrocephalus or extra-axial collection. Vascular: There is atherosclerotic calcification of the major vessels at the base of the brain. Skull: Normal Sinuses/Orbits: Clear/normal Other: None IMPRESSION: No acute finding by CT.  Chronic atrophic changes. Electronically Signed   By: Paulina Fusi M.D.   On: 02/01/2019 11:26   Dg Chest Portable 1 View  Result Date: 02/01/2019 CLINICAL DATA:  Shortness of breath and wheezing. EXAM: PORTABLE CHEST 1 VIEW COMPARISON:  02/27/2018 prior radiographs FINDINGS: Cardiomediastinal silhouette is unchanged. Mild chronic peribronchial thickening again noted. There is no evidence of focal airspace disease, pulmonary edema, suspicious pulmonary nodule/mass, pleural effusion, or pneumothorax. No acute bony abnormalities are identified. IMPRESSION: No evidence of acute abnormality. Mild chronic peribronchial thickening. Electronically Signed   By: Harmon Pier M.D.   On: 02/01/2019 11:01    EKG:   Orders placed or performed during the hospital encounter of 02/01/19  . EKG 12-Lead  . EKG 12-Lead  EKG reviewed  and it shows sinus tachycardia 104 bpm with narrow QRS complex.  IMPRESSION AND PLAN:   85 year old female with history of Parkinson disease, diabetes mellitus type 2, baseline dementia, memory loss brought in because of altered mental status, found on the floor with hypoxia initially was on nonrebreather then switched to 6 L of oxygen and sats are now around 98%.  Patient work-up is essentially normal with the CT head showing no acute abnormality, chest x-ray is negative for acute abnormality, patient found to have elevated white count up to 14.8 with normal kidney function, UA slightly abnormal   #1. altered mental status likely secondary to malignant hypertension with BP more than 200s in the emergency room, BP improved, continue beta-blockers, ACE inhibitors, hydralazine, use IV as needed metoprolol as needed today, monitor on telemetry. 2.  Metabolic encephalopathy seems to be getting better, CT head unremarkable but will do MRI of the brain to evaluate for acute stroke, also get EEG as  patient was found with blood around the mouth and concern for seizure as she was found to have decreased responsiveness as well. 3.Diabetes mellitus type II, patient saw Dr. Tedd Sias in the past, 4, chronic low back pain 5.  History of Parkinson disease, follows up with Dr. Hale Bogus from neurology #6. acute cystitis, add IV Rocephin.  Urine cultures. All the records are reviewed and case discussed with ED provider. Management plans discussed with the patient, family and they are in agreement.  CODE STATUS: DNR as per previous charts TOTAL TIME TAKING CARE OF THIS PATIENT: 55 minutes.    Katha Hamming M.D on 02/01/2019 at 5:20 PM  Between 7am to 6pm - Pager - [phone]  After 6pm go to [email or web address] - password [email or web address]  [facility]  Office  [phone]  CC: Primary care physician; System, Pcp Not In  Note: This dictation was prepared with Dragon dictation along with smaller  phrase technology. Any transcriptional errors that result from this process are unintentional.

## 2019-02-01 NOTE — ED Notes (Signed)
X-ray at bedside

## 2019-02-01 NOTE — Progress Notes (Signed)
Weaned to 2L o2. Saturations above 95%. Will continue to monitor and wean as tolerated.   Suzan Slick, RN

## 2019-02-01 NOTE — ED Notes (Signed)
RT at bedside to place pt on bipap

## 2019-02-01 NOTE — ED Notes (Signed)
Pt currently on 6L [place] and holding at 100%.

## 2019-02-01 NOTE — ED Notes (Signed)
Patients incontinence of urine brief changed and dry pad placed under patient. Patient cleaned and foley placed

## 2019-02-01 NOTE — ED Notes (Signed)
Pt taken to CT. Oxygen decreased to 10L non rebreather

## 2019-02-01 NOTE — ED Provider Notes (Signed)
[facility] Emergency Department Provider Note  Time seen: 11:38 AM  I have reviewed the triage vital signs and the nursing notes.   HISTORY  Chief Complaint Fall and Respiratory Distress    HPI Hannah Jackson is a 85 y.o. female with a past medical history of diabetes, hypertension, hyperlipidemia, Parkinson's, presents to the emergency department with decreased responsiveness/altered mental status.  Patient presents from home by EMS for decreased responsiveness.  Per EMS report patient lives alone, family found her this morning on the floor, confused with decreased responsiveness.  Upon arrival patient initially was satting in the 68s however when this was changed to an earlobe patient had a good waveform and was satting 100% on a nonrebreather.  Patient initially unresponsive per EMS however once I evaluated the patient she is able to speak and answer simple questions, but does appear quite somnolent.  Has dried blood in her mouth and around her face.  Patient does seem confused unable to contribute to her history or review of systems.     Past Medical History:  Diagnosis Date  . Asthma   . Diabetes mellitus without complication (HCC)    on metformin  . HLD (hyperlipidemia)   . HTN (hypertension)   . Parkinson's disease [facility])     Patient Active Problem List   Diagnosis Date Noted  . Hypoglycemia 02/26/2018  . UTI (urinary tract infection) 02/20/2018  . COPD (chronic obstructive pulmonary disease) (HCC)   . Asthma exacerbation 02/19/2018  . HTN (hypertension) 02/19/2018  . HLD (hyperlipidemia) 02/19/2018  . Diabetes (HCC) 02/19/2018  . Parkinson's disease (HCC) 02/19/2018    Past Surgical History:  Procedure Laterality Date  . BACK SURGERY      Prior to Admission medications   Medication Sig Start Date End Date Taking? Authorizing Provider  acetaminophen (TYLENOL) 650 MG CR tablet Take 650 mg by mouth 2 (two) times daily. For arthritis  pain/stiffness   Yes [provider]  albuterol (PROVENTIL) (2.5 MG/3ML) 0.083% nebulizer solution Take 2.5 mg by nebulization every 6 (six) hours as needed for wheezing or shortness of breath.   Yes [provider]  aspirin EC 81 MG tablet Take 81 mg by mouth daily.   Yes [provider]  benzonatate (TESSALON) 100 MG capsule Take 100 mg by mouth 3 (three) times daily as needed for cough.   Yes [provider]  budesonide (PULMICORT) 0.5 MG/2ML nebulizer solution Take 0.5 mg by nebulization 2 (two) times daily.   Yes [provider]  carbidopa-levodopa (SINEMET CR) 50-200 MG tablet Take 1 tablet by mouth 3 (three) times daily.    Yes [provider]  carbidopa-levodopa (SINEMET IR) 25-100 MG tablet Take 1 tablet by mouth 3 (three) times daily.   Yes [provider]  carboxymethylcellul-glycerin (OPTIVE) 0.5-0.9 % ophthalmic solution Place 1-2 drops into both eyes 4 (four) times daily as needed for dry eyes.   Yes [provider]  cholecalciferol (VITAMIN D) 1000 units tablet Take 1,000 Units by mouth daily.   Yes [provider]  diclofenac sodium (VOLTAREN) 1 % GEL Apply 2 g topically 3 (three) times daily as needed (for wrist pain).   Yes [provider]  glucose 4 GM chewable tablet Chew 1 tablet (4 g total) by mouth 2 (two) times daily. 02/24/18  Yes Auburn Bilberry, MD  guaiFENesin (MUCINEX) 600 MG 12 hr tablet Take 600 mg by mouth 2 (two) times daily as needed for to loosen phlegm.  Yes [provider]  guaiFENesin-dextromethorphan (ROBITUSSIN DM) 100-10 MG/5ML syrup Take 5 mLs by mouth every 4 (four) hours as needed for cough. 02/24/18  Yes Auburn Bilberry, MD  ibandronate (BONIVA) 150 MG tablet Take 150 mg by mouth every 30 (thirty) days. Take in the morning with a full glass of water, on an empty stomach, and do not take anything else by mouth or lie down for the next 60 min.   Yes [provider]  lactose free nutrition (BOOST PLUS) LIQD Take 237 mLs by mouth 3 (three) times daily with meals. 02/24/18  Yes Auburn Bilberry, MD  lisinopril (PRINIVIL,ZESTRIL) 5 MG tablet Take 5 mg by mouth daily.   Yes [provider]  mirabegron ER (MYRBETRIQ) 25 MG TB24 tablet Take 25 mg by mouth daily.   Yes [provider]  pregabalin (LYRICA) 75 MG capsule Take 75 mg by mouth at bedtime.    Yes [provider]  ranitidine (ZANTAC) 150 MG tablet Take 150 mg by mouth 2 (two) times daily.   Yes [provider]  senna-docusate (SENOKOT-S) 8.6-50 MG tablet Take 1 tablet by mouth daily.   Yes [provider]  Zinc Oxide 13 % CREA Apply 1 application topically as needed for irritation (apply cream to buttocks three times daily and as needed as a barrier cream).   Yes [provider]  budesonide (PULMICORT) 0.25 MG/2ML nebulizer solution Take 2 mLs (0.25 mg total) by nebulization 2 (two) times daily. Patient not taking: Reported on 02/01/2019 02/24/18   Auburn Bilberry, MD    Allergies  Allergen Reactions  . Penicillins     Has patient had a PCN reaction causing immediate rash, facial/tongue/throat swelling, SOB or lightheadedness with hypotension: Unknown Has patient had a PCN reaction causing severe rash involving mucus membranes or skin necrosis: Unknown Has patient had a PCN reaction that required hospitalization: Unknown Has patient had a PCN reaction occurring within the last 10 years: Unknown If all of the above answers are "NO", then may proceed with Cephalosporin use.     Family History  Problem Relation Age of Onset  . Leukemia Mother   . Stroke Mother   . CAD Father   . Hypertension Father   . Heart attack Father     Social History Social History   Tobacco Use  . Smoking status: Never Smoker  . Smokeless tobacco: Never Used  Substance Use Topics  . Alcohol use: No  . Drug use: No    Review of Systems Unable to  obtain an accurate/adequate review of systems secondary to unresponsiveness/decreased responsiveness.  ____________________________________________   PHYSICAL EXAM:  VITAL SIGNS: ED Triage Vitals  Enc Vitals Group     BP 02/01/19 1026 (!) 208/122     Pulse Rate 02/01/19 1026 (!) 105     Resp 02/01/19 1026 (!) 22     Temp --      Temp src --      SpO2 02/01/19 1026 (!) 59 %     Weight 02/01/19 1036 220 lb (99.8 kg)     Height 02/01/19 1036 5\' 7"  (1.702 m)     Head Circumference --      Peak Flow --      Pain Score --      Pain Loc --      Pain Edu? --      Excl. in GC? --    Constitutional: Is somnolent, does awaken to painful stimuli and will occasionally respond  to voice after multiple attempts.  Patient seems confused and cannot answer questions.  Attempts to follow simple commands with some success. Eyes: 2 mm bilaterally. ENT   Head: Normocephalic.  Patient has dried blood around her mouth.   Mouth/Throat: Extremely dry appearing mucous membranes.  Dried blood within the mouth.  No obvious lacerations on my exam. Cardiovascular: Normal rate, regular rhythm around 100 bpm. Respiratory: Normal respiratory effort without tachypnea.  No obvious wheeze rales or rhonchi. Gastrointestinal: Soft and nontender. No distention.   Musculoskeletal: Nontender with normal range of motion in all extremities. Neurologic:  Normal speech and language. No gross focal neurologic deficits Skin:  Skin is warm, dry and intact.  Psychiatric: Mood and affect are normal.  ____________________________________________    EKG  EKG viewed and interpreted by myself shows sinus tachycardia at 104 bpm with a narrow QRS, normal axis, normal intervals, no concerning ST changes.  ____________________________________________    RADIOLOGY  CT head is negative for acute abnormality. Chest x-ray is negative for acute abnormality.  ____________________________________________   INITIAL  IMPRESSION / ASSESSMENT AND PLAN / ED COURSE  Pertinent labs & imaging results that were available during my care of the patient were reviewed by me and considered in my medical decision making (see chart for details).  Patient presents to the emergency department with decreased responsiveness.  Differential is quite broad, would include infectious etiology such as urinary tract infection, pneumonia, sepsis, ICH/CVA, rhabdomyolysis.  We will check labs, chest x-ray, CT scan of the head and continue to closely monitor.  Patient's work-up has been essentially negative.  No significant findings however the patient continues to have decreased responsiveness.  We will admit to the hospital service for further treatment and work-up.  ____________________________________________   FINAL CLINICAL IMPRESSION(S) / ED DIAGNOSES  Decreased responsiveness/altered mental status   Minna Antis, MD 02/01/19 1554

## 2019-02-01 NOTE — ED Notes (Signed)
ED TO INPATIENT HANDOFF REPORT  ED Nurse Name and Phone #:  Darl Pikes 69 S Name/Age/Gender Hannah Jackson 85 y.o. female Room/Bed: ED14A/ED14A  Code Status   Code Status: DNR  Home/SNF/Other Home Patient oriented to: self, place, time and situation Is this baseline? No   Triage Complete: Triage complete  Chief Complaint Difficulty Breathing   Triage Note Pt arrives [facility] with non re breather on. Audible wheezing heard. Family told EMS that pt is dementia, DM, parkinsons, lives by self. Arrives with DNR form. Family found pt on floor this AM. Arrives with dried blood noted to L side of face, possible from her mouth. No active breathing.   EMS states initial sats were in 50's, placed on non re breather, EMS stats oxygen levels came up. At 15 L upon arrival. Called RT to place pt on bipap.    Allergies Allergies  Allergen Reactions  . Penicillins     Has patient had a PCN reaction causing immediate rash, facial/tongue/throat swelling, SOB or lightheadedness with hypotension: Unknown Has patient had a PCN reaction causing severe rash involving mucus membranes or skin necrosis: Unknown Has patient had a PCN reaction that required hospitalization: Unknown Has patient had a PCN reaction occurring within the last 10 years: Unknown If all of the above answers are "NO", then may proceed with Cephalosporin use.     Level of Care/Admitting Diagnosis ED Disposition    ED Disposition Condition Comment   Admit  Hospital Area: [facility] [[id number]]  Level of Care: Telemetry [5]  Diagnosis: AMS (altered mental status) [2233612]  Admitting Physician: Katha Hamming [[id number]]  Attending Physician: Katha Hamming [phone]  Estimated length of stay: past midnight tomorrow  Certification:: I certify this patient will need inpatient services for at least 2 midnights  PT Class (Do Not Modify): Inpatient [101]  PT Acc Code (Do Not Modify): Private [1]        B Medical/Surgery History Past Medical History:  Diagnosis Date  . Asthma   . Diabetes mellitus without complication (HCC)    on metformin  . HLD (hyperlipidemia)   . HTN (hypertension)   . Parkinson's disease [facility])    Past Surgical History:  Procedure Laterality Date  . BACK SURGERY       A IV Location/Drains/Wounds Patient Lines/Drains/Airways Status   Active Line/Drains/Airways    Name:   Placement date:   Placement time:   Site:   Days:   Peripheral IV 02/01/19 Right Forearm   02/01/19    1039    Forearm   less than 1   Peripheral IV 02/01/19 Left Forearm   02/01/19    1039    Forearm   less than 1   Urethral Catheter amber Double-lumen 14 Fr.   02/01/19    1206    Double-lumen   less than 1          Intake/Output Last 24 hours No intake or output data in the 24 hours ending 02/01/19 1430  Labs/Imaging Results for orders placed or performed during the hospital encounter of 02/01/19 (from the past 48 hour(s))  Lactic acid, plasma     Status: None   Collection Time: 02/01/19 10:34 AM  Result Value Ref Range   Lactic Acid, Venous 1.5 0.5 - 1.9 mmol/L    Comment: Performed at [facility], [address], [place], [place] [address]  Comprehensive metabolic panel     Status: Abnormal   Collection Time: 02/01/19 10:38 AM  Result  Value Ref Range   Sodium 138 135 - 145 mmol/L   Potassium 4.4 3.5 - 5.1 mmol/L   Chloride 103 98 - 111 mmol/L   CO2 25 22 - 32 mmol/L   Glucose, Bld 184 (H) 70 - 99 mg/dL   BUN 23 8 - 23 mg/dL   Creatinine, Ser 1.61 0.44 - 1.00 mg/dL   Calcium 8.6 (L) 8.9 - 10.3 mg/dL   Total Protein 7.4 6.5 - 8.1 g/dL   Albumin 4.9 3.5 - 5.0 g/dL   AST 18 15 - 41 U/L   ALT 9 0 - 44 U/L   Alkaline Phosphatase 54 38 - 126 U/L   Total Bilirubin 1.1 0.3 - 1.2 mg/dL   GFR calc non Af Amer >60 >60 mL/min   GFR calc Af Amer >60 >60 mL/min   Anion gap 10 5 - 15    Comment: Performed at [facility], [address] [place],  [place] [address]  CBC with Differential     Status: Abnormal   Collection Time: 02/01/19 10:38 AM  Result Value Ref Range   WBC 14.8 (H) 4.0 - 10.5 K/uL   RBC 4.70 3.87 - 5.11 MIL/uL   Hemoglobin 13.4 12.0 - 15.0 g/dL   HCT 54.0 98.1 - 19.1 %   MCV 91.1 80.0 - 100.0 fL   MCH 28.5 26.0 - 34.0 pg   MCHC 31.3 30.0 - 36.0 g/dL   RDW 47.8 29.5 - 62.1 %   Platelets 214 150 - 400 K/uL   nRBC 0.0 0.0 - 0.2 %   Neutrophils Relative % 95 %   Neutro Abs 14.0 (H) 1.7 - 7.7 K/uL   Lymphocytes Relative 2 %   Lymphs Abs 0.3 (L) 0.7 - 4.0 K/uL   Monocytes Relative 2 %   Monocytes Absolute 0.4 0.1 - 1.0 K/uL   Eosinophils Relative 0 %   Eosinophils Absolute 0.0 0.0 - 0.5 K/uL   Basophils Relative 0 %   Basophils Absolute 0.0 0.0 - 0.1 K/uL   Immature Granulocytes 1 %   Abs Immature Granulocytes 0.07 0.00 - 0.07 K/uL    Comment: Performed at [facility], [address] [place], [place] [address]  Protime-INR     Status: None   Collection Time: 02/01/19 10:38 AM  Result Value Ref Range   Prothrombin Time 13.2 11.4 - 15.2 seconds   INR 1.0 0.8 - 1.2    Comment: (NOTE) INR goal varies based on device and disease states. Performed at [facility], [address] [place], [place] [address]   CK     Status: Abnormal   Collection Time: 02/01/19 10:38 AM  Result Value Ref Range   Total CK 341 (H) 38 - 234 U/L    Comment: Performed at [facility], [address] [place], [place] [address]  Urinalysis, Complete w Microscopic     Status: Abnormal   Collection Time: 02/01/19 12:00 PM  Result Value Ref Range   Color, Urine YELLOW (A) YELLOW   APPearance HAZY (A) CLEAR   Specific Gravity, Urine 1.019 1.005 - 1.030   pH 5.0 5.0 - 8.0   Glucose, UA NEGATIVE NEGATIVE mg/dL   Hgb urine dipstick MODERATE (A) NEGATIVE   Bilirubin Urine NEGATIVE NEGATIVE   Ketones, ur 20 (A) NEGATIVE mg/dL   Protein, ur 30 (A) NEGATIVE mg/dL   Nitrite NEGATIVE NEGATIVE   Leukocytes,Ua SMALL  (A) NEGATIVE   RBC / HPF 0-5 0 - 5 RBC/hpf  WBC, UA 0-5 0 - 5 WBC/hpf   Bacteria, UA RARE (A) NONE SEEN   Squamous Epithelial / LPF 11-20 0 - 5   Mucus PRESENT     Comment: Performed at [facility], [address] [place], [place] [address]   Ct Head Wo Contrast  Result Date: 02/01/2019 CLINICAL DATA:  Dementia. Parkinson's disease. Found on the floor today. EXAM: CT HEAD WITHOUT CONTRAST TECHNIQUE: Contiguous axial images were obtained from the base of the skull through the vertex without intravenous contrast. COMPARISON:  09/16/2015 FINDINGS: Brain: Generalized atrophy. No sign of acute infarction, mass lesion, hemorrhage, hydrocephalus or extra-axial collection. Vascular: There is atherosclerotic calcification of the major vessels at the base of the brain. Skull: Normal Sinuses/Orbits: Clear/normal Other: None IMPRESSION: No acute finding by CT.  Chronic atrophic changes. Electronically Signed   By: Paulina Fusi M.D.   On: 02/01/2019 11:26   Dg Chest Portable 1 View  Result Date: 02/01/2019 CLINICAL DATA:  Shortness of breath and wheezing. EXAM: PORTABLE CHEST 1 VIEW COMPARISON:  02/27/2018 prior radiographs FINDINGS: Cardiomediastinal silhouette is unchanged. Mild chronic peribronchial thickening again noted. There is no evidence of focal airspace disease, pulmonary edema, suspicious pulmonary nodule/mass, pleural effusion, or pneumothorax. No acute bony abnormalities are identified. IMPRESSION: No evidence of acute abnormality. Mild chronic peribronchial thickening. Electronically Signed   By: Harmon Pier M.D.   On: 02/01/2019 11:01    Pending Labs Unresulted Labs (From admission, onward)    Start     Ordered   02/08/19 0500  Creatinine, serum  (enoxaparin (LOVENOX)    CrCl >/= 30 ml/min)  Weekly,   STAT    Comments:  while on enoxaparin therapy    02/01/19 1412   02/02/19 0500  Basic metabolic panel  Tomorrow morning,   STAT     02/01/19 1412   02/02/19 0500  CBC   Tomorrow morning,   STAT     02/01/19 1412   02/01/19 1410  CBC  (enoxaparin (LOVENOX)    CrCl >/= 30 ml/min)  Once,   STAT    Comments:  Baseline for enoxaparin therapy IF NOT ALREADY DRAWN.  Notify MD if PLT < 100 K.    02/01/19 1412   02/01/19 1410  Creatinine, serum  (enoxaparin (LOVENOX)    CrCl >/= 30 ml/min)  Once,   STAT    Comments:  Baseline for enoxaparin therapy IF NOT ALREADY DRAWN.    02/01/19 1412   02/01/19 1255  Urine culture  Add-on,   AD     02/01/19 1254   02/01/19 1038  Lactic acid, plasma  Now then every 2 hours,   STAT     02/01/19 1037   02/01/19 1038  Culture, blood (Routine x 2)  BLOOD CULTURE X 2,   STAT     02/01/19 1037          Vitals/Pain Today's Vitals   02/01/19 1203 02/01/19 1230 02/01/19 1300 02/01/19 1330  BP:  (!) 217/197 (!) 154/116 (!) 183/92  Pulse:  (!) 102 (!) 109 (!) 101  Resp:  (!) Temp: 98.7 F (37.1 C)     TempSrc: Rectal     SpO2:  100% 100% 100%  Weight:      Height:        Isolation Precautions No active isolations  Medications Medications  budesonide (PULMICORT) nebulizer solution 0.25 mg (has no administration in time range)  lisinopril (PRINIVIL,ZESTRIL) tablet 5 mg (has no administration  in time range)  aspirin EC tablet 81 mg (has no administration in time range)  carbidopa-levodopa (SINEMET CR) 50-200 MG per tablet controlled release 1 tablet (has no administration in time range)  mirabegron ER (MYRBETRIQ) tablet 25 mg (has no administration in time range)  lactose free nutrition (BOOST PLUS) liquid 237 mL (has no administration in time range)  pregabalin (LYRICA) capsule 75 mg (has no administration in time range)  zinc oxide (BALMEX) 11.3 % cream 1 application (has no administration in time range)  acetaminophen (TYLENOL) tablet 650 mg (has no administration in time range)    Or  acetaminophen (TYLENOL) suppository 650 mg (has no administration in time range)  docusate sodium (COLACE) capsule 100 mg  (has no administration in time range)  bisacodyl (DULCOLAX) EC tablet 5 mg (has no administration in time range)  ondansetron (ZOFRAN) tablet 4 mg (has no administration in time range)    Or  ondansetron (ZOFRAN) injection 4 mg (has no administration in time range)  enoxaparin (LOVENOX) injection 40 mg (has no administration in time range)  hydrALAZINE (APRESOLINE) tablet 25 mg (has no administration in time range)  metoprolol tartrate (LOPRESSOR) injection 5 mg (has no administration in time range)  insulin aspart (novoLOG) injection 0-9 Units (has no administration in time range)  sodium chloride 0.9 % bolus 1,000 mL (1,000 mLs Intravenous Bolus from Bag 02/01/19 1140)    Mobility non-ambulatory High fall risk   Focused Assessments Pulmonary Assessment Handoff:  Lung sounds: Bilateral Breath Sounds: Diminished, Expiratory wheezes L Breath Sounds: Diminished, Expiratory wheezes R Breath Sounds: Diminished, Expiratory wheezes O2 Device: NRB O2 Flow Rate (L/min): 15 L/min      R Recommendations: See Admitting Provider Note  Report given to:   Additional Notes:

## 2019-02-02 ENCOUNTER — Inpatient Hospital Stay: Payer: Medicare (Managed Care)

## 2019-02-02 ENCOUNTER — Other Ambulatory Visit: Payer: Medicare (Managed Care)

## 2019-02-02 DIAGNOSIS — R4182 Altered mental status, unspecified: Secondary | ICD-10-CM

## 2019-02-02 LAB — BASIC METABOLIC PANEL
Anion gap: 5 (ref 5–15)
BUN: 17 mg/dL (ref 8–23)
CO2: 28 mmol/L (ref 22–32)
Calcium: 8 mg/dL — ABNORMAL LOW (ref 8.9–10.3)
Chloride: 109 mmol/L (ref 98–111)
Creatinine, Ser: 0.67 mg/dL (ref 0.44–1.00)
GFR calc Af Amer: >60 mL/min (ref >60)
Glucose, Bld: 113 mg/dL — ABNORMAL HIGH (ref 70–99)
Potassium: 4.2 mmol/L (ref 3.5–5.1)
Sodium: 142 mmol/L (ref 135–145)

## 2019-02-02 LAB — CBC
HCT: 38.4 % (ref 36.0–46.0)
Hemoglobin: 11.9 g/dL — ABNORMAL LOW (ref 12.0–15.0)
MCH: 28.6 pg (ref 26.0–34.0)
MCHC: 31 g/dL (ref 30.0–36.0)
MCV: 92.3 fL (ref 80.0–100.0)
Platelets: 180 10*3/uL (ref 150–400)
RBC: 4.16 MIL/uL (ref 3.87–5.11)
RDW: 12.6 % (ref 11.5–15.5)
WBC: 11.2 10*3/uL — ABNORMAL HIGH (ref 4.0–10.5)
nRBC: 0 % (ref 0.0–0.2)

## 2019-02-02 LAB — GLUCOSE, CAPILLARY
Glucose-Capillary: 75 mg/dL (ref 70–99)
Glucose-Capillary: 94 mg/dL (ref 70–99)
Glucose-Capillary: 105 mg/dL — ABNORMAL HIGH (ref 70–99)

## 2019-02-02 MED ORDER — BUDESONIDE 0.5 MG/2ML IN SUSP
0.5000 mg | Freq: Two times a day (BID) | RESPIRATORY_TRACT | Status: DC
  Administered 2019-02-02 – 2019-02-04 (×4): 0.5 mg via RESPIRATORY_TRACT
  Filled 2019-02-02 (×4): qty 2

## 2019-02-02 MED ORDER — CARBIDOPA-LEVODOPA 25-100 MG PO TABS
1.0000 | ORAL_TABLET | Freq: Three times a day (TID) | ORAL | Status: DC
  Administered 2019-02-02 – 2019-02-04 (×6): 1 via ORAL
  Filled 2019-02-02 (×9): qty 1

## 2019-02-02 MED ORDER — ATORVASTATIN CALCIUM 80 MG PO TABS
80.0000 mg | ORAL_TABLET | Freq: Every day | ORAL | Status: DC
  Administered 2019-02-02 – 2019-02-03 (×2): 80 mg via ORAL
  Filled 2019-02-02: qty 4
  Filled 2019-02-02: qty 1
  Filled 2019-02-02: qty 4
  Filled 2019-02-02 (×2): qty 1

## 2019-02-02 MED ORDER — SODIUM CHLORIDE 0.9 % IV SOLN
INTRAVENOUS | Status: DC | PRN
  Administered 2019-02-02: 250 mL via INTRAVENOUS

## 2019-02-02 NOTE — Progress Notes (Signed)
eeg completed ° °

## 2019-02-02 NOTE — Progress Notes (Addendum)
[facility] [place] [facility]   PATIENT NAME: Hannah Jackson    MR#:  [id number]  DATE OF BIRTH:  1928-01-27  SUBJECTIVE:  CHIEF COMPLAINT:   Chief Complaint  Patient presents with   Fall   Respiratory Distress   Seen resting in bed prior to MRI with family at bedside. Complains of some back pain and abdominal discomfort (points to suprapubic area). "Hungry". No other complaints.  REVIEW OF SYSTEMS:  CONSTITUTIONAL: No fever, fatigue or weakness.  EYES: No blurred or double vision.  EARS, NOSE, AND THROAT: No tinnitus or ear pain.  RESPIRATORY: No cough, shortness of breath, wheezing or hemoptysis.  CARDIOVASCULAR: No chest pain, orthopnea, edema.  GASTROINTESTINAL: No nausea, vomiting, diarrhea or abdominal pain. + abdominal discomfort GENITOURINARY: No dysuria, hematuria.  ENDOCRINE: No polyuria, nocturia,  HEMATOLOGY: No anemia, easy bruising or bleeding SKIN: No rash or lesion. MUSCULOSKELETAL: Complains of lower back pain.Marland Kitchen NEUROLOGIC: No tingling, numbness, weakness. + confusion PSYCHIATRY: No anxiety or depression.  DRUG ALLERGIES:   Allergies  Allergen Reactions   Penicillins     Has patient had a PCN reaction causing immediate rash, facial/tongue/throat swelling, SOB or lightheadedness with hypotension: Unknown Has patient had a PCN reaction causing severe rash involving mucus membranes or skin necrosis: Unknown Has patient had a PCN reaction that required hospitalization: Unknown Has patient had a PCN reaction occurring within the last 10 years: Unknown If all of the above answers are "NO", then may proceed with Cephalosporin use.    VITALS:  Blood pressure 97/74, pulse 72, temperature 99 F (37.2 C), temperature source Oral, resp. rate 16, height  (1.702 m), weight 92.9 kg, SpO2 100 %. PHYSICAL EXAMINATION:  GENERAL:  85 y.o.-year-old patient lying in the bed with no acute distress.  EYES: Pupils equal, round, reactive to light  and accommodation. No scleral icterus. Extraocular muscles intact.  HEENT: Head atraumatic, normocephalic. Oropharynx and nasopharynx clear.  NECK:  Supple, no jugular venous distention. No thyroid enlargement, no tenderness.  LUNGS: Normal breath sounds bilaterally, no wheezing, rales,rhonchi or crepitation. No use of accessory muscles of respiration.  CARDIOVASCULAR: S1, S2 normal. No murmurs, rubs, or gallops.  ABDOMEN: Soft, nontender, nondistended. Bowel sounds present. No organomegaly or mass.  EXTREMITIES: No pedal edema, cyanosis, or clubbing.  NEUROLOGIC: Cranial nerves II through XII are intact. Muscle strength 5/5 in upper extremities. 4/5 in lower extremities. Sensation intact. Gait not checked. No aphasia noted. PSYCHIATRIC: The patient is alert and oriented x 3 today. SKIN: No obvious rash, lesion, or ulcer.  LABORATORY PANEL:  Female CBC Recent Labs  Lab 02/02/19 0522  WBC 11.2*  HGB 11.9*  HCT 38.4  PLT 180   ------------------------------------------------------------------------------------------------------------------ Chemistries  Recent Labs  Lab 02/01/19 1038 02/02/19 0522  NA 138 142  K 4.4 4.2  CL 103 109  CO2 25 28  GLUCOSE 184* 113*  BUN 23 17  CREATININE 0.72 0.67  CALCIUM 8.6* 8.0*  AST 18  --   ALT 9  --   ALKPHOS 54  --   BILITOT 1.1  --    RADIOLOGY:  Mr Brain Wo Contrast  Result Date: 02/02/2019 CLINICAL DATA:  Altered level of consciousness.  Found on floor EXAM: MRI HEAD WITHOUT CONTRAST TECHNIQUE: Multiplanar, multiecho pulse sequences of the brain and surrounding structures were obtained without intravenous contrast. COMPARISON:  CT head 02/01/2019 FINDINGS: Brain: Small area of acute infarction in the left internal capsule. No other acute infarct. Mild chronic microvascular ischemic  changes in the white matter. Negative for hemorrhage or mass. Negative for hydrocephalus. Vascular: Normal arterial flow voids Skull and upper cervical spine:  Negative Sinuses/Orbits: Paranasal sinuses clear.  Bilateral cataract surgery Other: None IMPRESSION: Small acute infarct left internal capsule. Mild chronic microvascular ischemic changes in the white matter. Electronically Signed   By: Marlan Palau M.D.   On: 02/02/2019 12:33   US Carotid Bilateral  Result Date: 02/02/2019 CLINICAL DATA:  Altered mental status EXAM: BILATERAL CAROTID DUPLEX ULTRASOUND TECHNIQUE: Wallace Cullens scale imaging, color Doppler and duplex ultrasound were performed of bilateral carotid and vertebral arteries in the neck. COMPARISON:  None. FINDINGS: Criteria: Quantification of carotid stenosis is based on velocity parameters that correlate the residual internal carotid diameter with NASCET-based stenosis levels, using the diameter of the distal internal carotid lumen as the denominator for stenosis measurement. The following velocity measurements were obtained: RIGHT ICA: 91 cm/sec CCA: 117 cm/sec SYSTOLIC ICA/CCA RATIO:  0.8 ECA: 116 cm/sec LEFT ICA: 126 cm/sec CCA: 95 cm/sec SYSTOLIC ICA/CCA RATIO:  1.3 ECA: 107 cm/sec RIGHT CAROTID ARTERY: Minimal plaque in the bulb. Low resistance internal carotid Doppler pattern is preserved. RIGHT VERTEBRAL ARTERY:  Antegrade. LEFT CAROTID ARTERY: Minimal intimal thickening in the bulb. Low resistance internal carotid Doppler pattern is preserved. LEFT VERTEBRAL ARTERY:  Antegrade. IMPRESSION: Less than 50% stenosis in the right and left internal carotid arteries. Electronically Signed   By: Jolaine Click M.D.   On: 02/02/2019 15:01   ASSESSMENT AND PLAN:   86 year old female with history of Parkinson disease, diabetes mellitus type 2, baseline dementia, memory loss brought in because of altered mental status, found on the floor with hypoxia initially was on nonrebreather then switched to 6 L of oxygen and sats are now around 98-100%. CT head showing no acute abnormality, chest x-ray is negative for acute abnormality, patient found to have elevated  white count up to 14.8 with normal kidney function, UA slightly abnormal   1. altered mental status likely secondary to malignant hypertension with BP more than 200s in the emergency room, BP improved, continue beta-blockers, ACE inhibitors, hydralazine, use IV as needed metoprolol as needed today, monitor on telemetry. Could be contribution here from her UTI. See below.   2.  Metabolic encephalopathy seems to be getting better, CT head unremarkable but will do MRI of the brain to evaluate for acute stroke - small acute infarct of the internal capsule -Neuro consulted - continue aspirin, statin added, PT/OT/SLP to evaluate, no further stroke workup - Etiology likely small vessel disease in the setting of malignant hypertension with elevated blood pressure greater than 200s on presentation.  Unlikely that this small subcortical infarct could be contributing to her altered mental status. -EEG as patient was found with blood around the mouth and concern for seizure as she was found to have decreased responsiveness as well - Per Dr. Amada Jupiter: This EEG is consistent with a mild generalized nonspecific cerebral dysfunction (encephalopathy). There was no seizure or seizure predisposition recorded on this study. Please note that lack of epileptiform activity on EEG does not preclude the possibility of epilepsy.   3. Diabetes mellitus type II, patient saw Dr. Tedd Sias in the past. Insulin SSI  4. chronic low back pain continue home meds  5.  History of Parkinson disease, follows up with Dr. Malvin Johns from neurology. Continue Sinamet.  6. acute cystitis, was on IV Rocephin. Now on Cipro IV. Urine cultures in process will follow. Blood culture no growth < 24 hours will follow.  All the records are reviewed and case is discussed with Care Management/Social Worker. Management plans discussed with the patient and/or family and they are in agreement.  CODE STATUS: DNR  TOTAL TIME TAKING CARE OF THIS PATIENT:  30 minutes.   More than 50% of the time was spent in counseling/coordination of care: YES  POSSIBLE D/C IN 1-2 DAYS, DEPENDING ON CLINICAL CONDITION.   Amay Mijangos PA-C on 02/02/2019 at 4:47 PM  Between 7am to 6pm - Pager - [phone]  After 6 pm go to [email or web address] - Social research officer, government  [facility]  Office  [phone]  CC: Primary care physician; System, Pcp Not In  Note: This dictation was prepared with Dragon dictation along with smaller phrase technology. Any transcriptional errors that result from this process are unintentional.

## 2019-02-02 NOTE — Procedures (Signed)
History: 85 year old female being evaluated for possible seizure.  Sedation: None  Technique: This is a 21 channel routine scalp EEG performed at the bedside with bipolar and monopolar montages arranged in accordance to the international 10/20 system of electrode placement. One channel was dedicated to EKG recording.    Background: There is a well defined posterior dominant rhythm of 7-8 Hz that attenuates with eye opening.  Even with maximal wakefulness, there is mild intrusion of generalized irregular delta and theta activities into the background.  Sleep is not  recorded.   Photic stimulation: Physiologic driving is not performed  EEG Abnormalities: 1) generalized irregular slow activity intrusion into the background 2) slow PDR  Clinical Interpretation: This EEG is consistent with a mild generalized nonspecific cerebral dysfunction (encephalopathy). There was no seizure or seizure predisposition recorded on this study. Please note that lack of epileptiform activity on EEG does not preclude the possibility of epilepsy.   Ritta Slot, MD [facility] [phone]  If 7pm- 7am, please page neurology on call as listed in AMION.

## 2019-02-02 NOTE — Consult Note (Addendum)
Referring Physician: Delfino Lovett, MD    Chief Complaint: Altered mental status  HPI: Hannah Jackson is an 85 y.o. female with past medical history of Parkinson's disease, dementia, diabetes mellitus, hyperlipidemia, and hypertension, presenting to the ED on 02/01/2019 with altered mental status.  Per ED reports patient lives by herself she was apparently found on the floor yesterday morning by family members. On arrival to the ED, she was noted to have dried blood to the left side of her face possibly coming from her mouth.  Per EMS her initial sats were in the 50s therefore she was placed on a nonrebreather at 15 L.  She was afebrile with blood pressure 208/122 mm Hg and pulse rate 105 beats/min. There were no focal neurological deficits; she was however somnolent, not awaken to painful stimuli and will occasionally respond to voice after multiple attempts.  Patient seemed confused and could not answer questions.  Patient labs revealed lactic acid levels of 1.5, CK 341, WBC 14.8, blood glucose 184, urinalysis positive for UTI. ECG showed sinus tachycardia of 104 beats per minute and the chest X-ray was normal. A non-contrast head CT showed no acute intracranial abnormality.  Patient was therefore admitted for further work-up and management.  Follow-up MRI of the brain was obtained today and showed no acute infarct in the left internal capsule.  Neurology was therefore consulted for further evaluation.  Date last known well: Unable to determine Time last known well: Unable to determine tPA Given: No: Unable to determine last known well  Past Medical History:  Diagnosis Date  . Asthma   . Diabetes mellitus without complication (HCC)    on metformin  . HLD (hyperlipidemia)   . HTN (hypertension)   . Parkinson's disease [facility])     Past Surgical History:  Procedure Laterality Date  . BACK SURGERY      Family History  Problem Relation Age of Onset  . Leukemia Mother   . Stroke Mother   . CAD  Father   . Hypertension Father   . Heart attack Father    Social History:  reports that she has never smoked. She has never used smokeless tobacco. She reports that she does not drink alcohol or use drugs.  Allergies:  Allergies  Allergen Reactions  . Penicillins     Has patient had a PCN reaction causing immediate rash, facial/tongue/throat swelling, SOB or lightheadedness with hypotension: Unknown Has patient had a PCN reaction causing severe rash involving mucus membranes or skin necrosis: Unknown Has patient had a PCN reaction that required hospitalization: Unknown Has patient had a PCN reaction occurring within the last 10 years: Unknown If all of the above answers are "NO", then may proceed with Cephalosporin use.     Medications:  I have reviewed the patient's current medications. Prior to Admission:  Medications Prior to Admission  Medication Sig Dispense Refill Last Dose  . acetaminophen (TYLENOL) 650 MG CR tablet Take 650 mg by mouth 2 (two) times daily. For arthritis pain/stiffness   Past Week at Unknown time  . albuterol (PROVENTIL) (2.5 MG/3ML) 0.083% nebulizer solution Take 2.5 mg by nebulization every 6 (six) hours as needed for wheezing or shortness of breath.   Unknown at PRN  . aspirin EC 81 MG tablet Take 81 mg by mouth daily.   Past Week at Unknown time  . benzonatate (TESSALON) 100 MG capsule Take 100 mg by mouth 3 (three) times daily as needed for cough.   Unknown at PRN  .  budesonide (PULMICORT) 0.5 MG/2ML nebulizer solution Take 0.5 mg by nebulization 2 (two) times daily.   Past Week at Unknown time  . carbidopa-levodopa (SINEMET CR) 50-200 MG tablet Take 1 tablet by mouth 3 (three) times daily.    Past Week at Unknown time  . carbidopa-levodopa (SINEMET IR) 25-100 MG tablet Take 1 tablet by mouth 3 (three) times daily.   Past Week at Unknown time  . carboxymethylcellul-glycerin (OPTIVE) 0.5-0.9 % ophthalmic solution Place 1-2 drops into both eyes 4 (four) times  daily as needed for dry eyes.   Unknown at PRN  . cholecalciferol (VITAMIN D) 1000 units tablet Take 1,000 Units by mouth daily.   Past Week at Unknown time  . diclofenac sodium (VOLTAREN) 1 % GEL Apply 2 g topically 3 (three) times daily as needed (for wrist pain).   Unknown at PRN  . glucose 4 GM chewable tablet Chew 1 tablet (4 g total) by mouth 2 (two) times daily. 50 tablet 12 Past Week at Unknown time  . guaiFENesin (MUCINEX) 600 MG 12 hr tablet Take 600 mg by mouth 2 (two) times daily as needed for to loosen phlegm.    Unknown at PRN  . guaiFENesin-dextromethorphan (ROBITUSSIN DM) 100-10 MG/5ML syrup Take 5 mLs by mouth every 4 (four) hours as needed for cough. 118 mL 0 Unknown at PRN  . ibandronate (BONIVA) 150 MG tablet Take 150 mg by mouth every 30 (thirty) days. Take in the morning with a full glass of water, on an empty stomach, and do not take anything else by mouth or lie down for the next 60 min.   Past Month at Unknown time  . lactose free nutrition (BOOST PLUS) LIQD Take 237 mLs by mouth 3 (three) times daily with meals.  0 Past Week at Unknown time  . lisinopril (PRINIVIL,ZESTRIL) 5 MG tablet Take 5 mg by mouth daily.   Past Week at Unknown time  . mirabegron ER (MYRBETRIQ) 25 MG TB24 tablet Take 25 mg by mouth daily.   Past Week at Unknown time  . pregabalin (LYRICA) 75 MG capsule Take 75 mg by mouth at bedtime.    Past Week at Unknown time  . ranitidine (ZANTAC) 150 MG tablet Take 150 mg by mouth 2 (two) times daily.   Past Week at Unknown time  . senna-docusate (SENOKOT-S) 8.6-50 MG tablet Take 1 tablet by mouth daily.   Past Week at Unknown time  . Zinc Oxide 13 % CREA Apply 1 application topically as needed for irritation (apply cream to buttocks three times daily and as needed as a barrier cream).   Unknown at PRN  . budesonide (PULMICORT) 0.25 MG/2ML nebulizer solution Take 2 mLs (0.25 mg total) by nebulization 2 (two) times daily. (Patient not taking: Reported on 02/01/2019) 60  mL 12 Not Taking at Unknown time   Scheduled: . aspirin EC  81 mg Oral Daily  . atorvastatin  80 mg Oral q1800  . budesonide (PULMICORT) nebulizer solution  0.5 mg Nebulization BID  . carbidopa-levodopa  1 tablet Oral TID  . carbidopa-levodopa  1 tablet Oral TID  . docusate sodium  100 mg Oral BID  . enoxaparin (LOVENOX) injection  40 mg Subcutaneous Q24H  . hydrALAZINE  25 mg Oral Q8H  . insulin aspart  0-9 Units Subcutaneous TID WC  . lactose free nutrition  237 mL Oral TID WC  . lisinopril  5 mg Oral Daily  . mirabegron ER  25 mg Oral Daily  . pregabalin  75 mg Oral QHS  . sodium chloride flush  3 mL Intravenous Q12H    ROS: History obtained from the patient   General ROS: negative for - chills, fatigue, fever, night sweats, weight gain or weight loss Psychological ROS: negative for - behavioral disorder, hallucinations, memory difficulties, mood swings or suicidal ideation Ophthalmic ROS: negative for - blurry vision, double vision, eye pain or loss of vision ENT ROS: negative for - epistaxis, nasal discharge, oral lesions, sore throat, tinnitus or vertigo Allergy and Immunology ROS: negative for - hives or itchy/watery eyes Hematological and Lymphatic ROS: negative for - bleeding problems, bruising or swollen lymph nodes Endocrine ROS: negative for - galactorrhea, hair pattern changes, polydipsia/polyuria or temperature intolerance Respiratory ROS: negative for - cough, hemoptysis, shortness of breath or wheezing Cardiovascular ROS: negative for - chest pain, dyspnea on exertion, edema or irregular heartbeat Gastrointestinal ROS: negative for - abdominal pain, diarrhea, hematemesis, nausea/vomiting or stool incontinence Genito-Urinary ROS: negative for - dysuria, hematuria, incontinence or urinary frequency/urgency Musculoskeletal ROS: negative for - joint swelling or muscular weakness Neurological ROS: as noted in HPI Dermatological ROS: negative for rash and skin lesion  changes  Physical Examination: Blood pressure (!) 142/74, pulse 72, temperature 98.5 F (36.9 C), temperature source Oral, resp. rate 16, height 5\' 7"  (1.702 m), weight 92.9 kg, SpO2 99 %.   HEENT-  Normocephalic, no lesions, without obvious abnormality.  Normal external eye and conjunctiva.  Normal TM's bilaterally.  Normal auditory canals and external ears. Normal external nose, mucus membranes and septum.  Normal pharynx. Cardiovascular- S1, S2 normal, pulses palpable throughout   Lungs- chest clear, no wheezing, rales, normal symmetric air entry Abdomen- soft, non-tender; bowel sounds normal; no masses,  no organomegaly Extremities- no edema Lymph-no adenopathy palpable Musculoskeletal-no joint tenderness, deformity or swelling Skin-warm and dry, no hyperpigmentation, vitiligo, or suspicious lesions  Neurological Exam   Mental Status: Alert, oriented x 3, thought content appropriate.  Speech fluent without evidence of aphasia.  Able to follow 3 step commands without difficulty. Attention span and concentration seemed appropriate  Cranial Nerves: II: Discs flat bilaterally; Visual fields grossly normal, pupils equal, round, reactive to light and accommodation III,IV, VI: ptosis not present, extra-ocular motions intact bilaterally V,VII: smile symmetric, facial light touch sensation intact VIII: hearing normal bilaterally IX,X: gag reflex present XI: bilateral shoulder shrug XII: midline tongue extension Motor: Right :  Upper extremity   5/5 Without pronator drift      Left: Upper extremity   5/5 without pronator drift Right:   Lower extremity   4/5                                          Left: Lower extremity   4/5 Tone and bulk:normal tone throughout; no atrophy noted Resting tremors right hand >Left hand Sensory: Pinprick and light touch intact bilaterally Deep Tendon Reflexes: 1+ and symmetric throughout Plantars: Right: mute                              Left:  mute Cerebellar: Finger-to-nose testing intact bilaterally. Unable to perform heel to shin testing Gait: not tested due to safety concerns  Data Reviewed  Laboratory Studies:  Basic Metabolic Panel: Recent Labs  Lab 02/01/19 1038 02/02/19 0522  NA 138 142  K 4.4 4.2  CL 103 109  CO2 25 28  GLUCOSE 184* 113*  BUN 23 17  CREATININE 0.72 0.67  CALCIUM 8.6* 8.0*    Liver Function Tests: Recent Labs  Lab 02/01/19 1038  AST 18  ALT 9  ALKPHOS 54  BILITOT 1.1  PROT 7.4  ALBUMIN 4.9   No results for input(s): LIPASE, AMYLASE in the last 168 hours. No results for input(s): AMMONIA in the last 168 hours.  CBC: Recent Labs  Lab 02/01/19 1038 02/02/19 0522  WBC 14.8* 11.2*  NEUTROABS 14.0*  --   HGB 13.4 11.9*  HCT 42.8 38.4  MCV 91.1 92.3  PLT 214 180    Cardiac Enzymes: Recent Labs  Lab 02/01/19 1038  CKTOTAL 341*    BNP: Invalid input(s): POCBNP  CBG: Recent Labs  Lab 02/01/19 1642 02/01/19 1959 02/02/19 0833  GLUCAP 135* 160* 75    Microbiology: Results for orders placed or performed during the hospital encounter of 02/01/19  Culture, blood (Routine x 2)     Status: None (Preliminary result)   Collection Time: 02/01/19 10:33 AM  Result Value Ref Range Status   Specimen Description BLOOD RFA  Final   Special Requests   Final    BOTTLES DRAWN AEROBIC AND ANAEROBIC Blood Culture adequate volume   Culture   Final    NO GROWTH < 24 HOURS Performed at [facility], [address] [place], [place] [address]    Report Status PENDING  Incomplete  Culture, blood (Routine x 2)     Status: None (Preliminary result)   Collection Time: 02/01/19 10:43 AM  Result Value Ref Range Status   Specimen Description BLOOD LAC  Final   Special Requests   Final    BOTTLES DRAWN AEROBIC AND ANAEROBIC Blood Culture results may not be optimal due to an inadequate volume of blood received in culture bottles   Culture   Final    NO GROWTH < 24  HOURS Performed at [facility], [address] [place], [place] [address]    Report Status PENDING  Incomplete    Coagulation Studies: Recent Labs    02/01/19 1038  LABPROT 13.2  INR 1.0    Urinalysis:  Recent Labs  Lab 02/01/19 1200  COLORURINE YELLOW*  LABSPEC 1.019  PHURINE 5.0  GLUCOSEU NEGATIVE  HGBUR MODERATE*  BILIRUBINUR NEGATIVE  KETONESUR 20*  PROTEINUR 30*  NITRITE NEGATIVE  LEUKOCYTESUR SMALL*    Lipid Panel: No results found for: CHOL, TRIG, HDL, CHOLHDL, VLDL, LDLCALC  HgbA1C:  Lab Results  Component Value Date   HGBA1C 6.0 (H) 02/21/2018    Urine Drug Screen:  No results found for: LABOPIA, COCAINSCRNUR, LABBENZ, AMPHETMU, THCU, LABBARB  Alcohol Level: No results for input(s): ETH in the last 168 hours.  Other results: EKG: unchanged from previous tracings, sinus tachycardia.  Imaging: Ct Head Wo Contrast  Result Date: 02/01/2019 CLINICAL DATA:  Dementia. Parkinson's disease. Found on the floor today. EXAM: CT HEAD WITHOUT CONTRAST TECHNIQUE: Contiguous axial images were obtained from the base of the skull through the vertex without intravenous contrast. COMPARISON:  09/16/2015 FINDINGS: Brain: Generalized atrophy. No sign of acute infarction, mass lesion, hemorrhage, hydrocephalus or extra-axial collection. Vascular: There is atherosclerotic calcification of the major vessels at the base of the brain. Skull: Normal Sinuses/Orbits: Clear/normal Other: None IMPRESSION: No acute finding by CT.  Chronic atrophic changes. Electronically Signed   By: Paulina Fusi M.D.   On: 02/01/2019 11:26   Mr Brain Wo Contrast  Result Date: 02/02/2019  CLINICAL DATA:  Altered level of consciousness.  Found on floor EXAM: MRI HEAD WITHOUT CONTRAST TECHNIQUE: Multiplanar, multiecho pulse sequences of the brain and surrounding structures were obtained without intravenous contrast. COMPARISON:  CT head 02/01/2019 FINDINGS: Brain: Small area of acute infarction  in the left internal capsule. No other acute infarct. Mild chronic microvascular ischemic changes in the white matter. Negative for hemorrhage or mass. Negative for hydrocephalus. Vascular: Normal arterial flow voids Skull and upper cervical spine: Negative Sinuses/Orbits: Paranasal sinuses clear.  Bilateral cataract surgery Other: None IMPRESSION: Small acute infarct left internal capsule. Mild chronic microvascular ischemic changes in the white matter. Electronically Signed   By: Marlan Palau M.D.   On: 02/02/2019 12:33   Dg Chest Portable 1 View  Result Date: 02/01/2019 CLINICAL DATA:  Shortness of breath and wheezing. EXAM: PORTABLE CHEST 1 VIEW COMPARISON:  02/27/2018 prior radiographs FINDINGS: Cardiomediastinal silhouette is unchanged. Mild chronic peribronchial thickening again noted. There is no evidence of focal airspace disease, pulmonary edema, suspicious pulmonary nodule/mass, pleural effusion, or pneumothorax. No acute bony abnormalities are identified. IMPRESSION: No evidence of acute abnormality. Mild chronic peribronchial thickening. Electronically Signed   By: Harmon Pier M.D.   On: 02/01/2019 11:01   Assessment: 85 y.o. female with past medical history of Parkinson's disease, dementia, diabetes mellitus, hyperlipidemia, and hypertension, presenting to the ED on 02/01/2019 with altered mental status. Patient apparently found down by family unclear how long she had been on the floor.  Initial CT head negative for acute intracranial abnormality.  Follow-up MRI of the brain today shows small acute infarct in the left internal capsule.  Etiology likely small vessel disease in the setting of malignant hypertension with elevated blood pressure greater than 200s on presentation.  Unlikely that this small subcortical infarct could be contributing to her altered mental status. She does admit that her gait is unsteady due to her Parkinson's Disease and may have tripped and fell unknowingly hitting her  head on the floor. Urinalysis shows UTI.  EEG abnormal secondary to generalized irregular slow activity intrusion into the background consistent with a mild generalized nonspecific cerebral dysfunction otherwise no seizures or seizure predisposition noted.  Patient was on aspirin 81 mg prior to this event.  Stroke Risk Factors - diabetes mellitus, family history, hyperlipidemia and hypertension  Plan: 1. Continue medical management with Aspirin 81 mg/day with intensive management of vascular risk factor to keep systolic BP (SBP) <140 mm Hg (960 mm Hg if diabetic). 2. Statin with goal low density lipoprotein (LDL) <70 mg/dl 3. PT/OT/Speech to evaluate 4. Agree with medical management of underlying medical condition 5. No further stroke work up recommended at this time.  If further questions arise, please call or page at that time.  Thank you for allowing neurology to participate in the care of this patient.  Patient to follow up with neurology on an outpatient basis  This patient was staffed with Dr. Loretha Brasil, Doyle Askew who personally evaluated patient, reviewed documentation and agreed with assessment and plan of care as above.  Webb Silversmith, DNP, FNP-BC Board certified Nurse Practitioner Neurology Department    02/02/2019, 2:45 PM

## 2019-02-03 LAB — URINE CULTURE: CULTURE: NO GROWTH

## 2019-02-03 LAB — CBC
HCT: 41.3 % (ref 36.0–46.0)
Hemoglobin: 12.6 g/dL (ref 12.0–15.0)
MCH: 28.1 pg (ref 26.0–34.0)
MCHC: 30.5 g/dL (ref 30.0–36.0)
MCV: 92.2 fL (ref 80.0–100.0)
Platelets: 182 10*3/uL (ref 150–400)
RBC: 4.48 MIL/uL (ref 3.87–5.11)
RDW: 12.9 % (ref 11.5–15.5)
WBC: 10.6 10*3/uL — ABNORMAL HIGH (ref 4.0–10.5)
nRBC: 0 % (ref 0.0–0.2)

## 2019-02-03 LAB — BASIC METABOLIC PANEL
ANION GAP: 9 (ref 5–15)
BUN: 26 mg/dL — ABNORMAL HIGH (ref 8–23)
CALCIUM: 8.6 mg/dL — AB (ref 8.9–10.3)
CO2: 25 mmol/L (ref 22–32)
Chloride: 109 mmol/L (ref 98–111)
Creatinine, Ser: 0.61 mg/dL (ref 0.44–1.00)
GFR calc Af Amer: >60 mL/min (ref >60)
GFR calc non Af Amer: >60 mL/min (ref >60)
Glucose, Bld: 119 mg/dL — ABNORMAL HIGH (ref 70–99)
Potassium: 3.9 mmol/L (ref 3.5–5.1)
Sodium: 143 mmol/L (ref 135–145)

## 2019-02-03 LAB — GLUCOSE, CAPILLARY
GLUCOSE-CAPILLARY: 73 mg/dL (ref 70–99)
Glucose-Capillary: 100 mg/dL — ABNORMAL HIGH (ref 70–99)
Glucose-Capillary: 155 mg/dL — ABNORMAL HIGH (ref 70–99)
Glucose-Capillary: 171 mg/dL — ABNORMAL HIGH (ref 70–99)

## 2019-02-03 MED ORDER — HYDRALAZINE HCL 25 MG PO TABS
25.0000 mg | ORAL_TABLET | Freq: Four times a day (QID) | ORAL | Status: DC
  Administered 2019-02-03 – 2019-02-04 (×5): 25 mg via ORAL
  Filled 2019-02-03 (×5): qty 1

## 2019-02-03 MED ORDER — LISINOPRIL 10 MG PO TABS
10.0000 mg | ORAL_TABLET | Freq: Every day | ORAL | Status: DC
  Administered 2019-02-04: 10 mg via ORAL
  Filled 2019-02-03: qty 1

## 2019-02-03 NOTE — TOC Initial Note (Signed)
Transition of Care [facility]) - Initial/Assessment Note    Patient Details  Name: Hannah Jackson MRN: [id number] Date of Birth: Nov 06, 1928  [facility] CM/SW Contact:    Darleene Cleaver, LCSW Phone Number: 02/03/2019, 6:14 PM  Clinical Narrative:    Patient is an 85 year old female who lives alone.  Patient is member of the [employer].  Patient expressed she is satisfied with the services she is receiving from [facility].  Patient was explained role of CSW and process for coordinating with [facility] team.  CSW explained what to expect at [facility] and process for going to SNF.  Patient stated she has been to rehab before, CSW was given permission to begin bed search in [facility].                 Expected Discharge Plan: Skilled Nursing Facility Barriers to Discharge: SNF Pending bed offer, Continued Medical Work up   Patient Goals and CMS Choice Patient states their goals for this hospitalization and ongoing recovery are:: Patient plans to get some rehab, then return back home. CMS Medicare.gov Compare Post Acute Care list provided to:: Patient Choice offered to / list presented to : Patient, NA([facility])  Expected Discharge Plan and Services Expected Discharge Plan: Skilled Nursing Facility   Post Acute Care Choice: Skilled Nursing Facility Living arrangements for the past 2 months: Single Family Home                          Prior Living Arrangements/Services Living arrangements for the past 2 months: Single Family Home Lives with:: Self Patient language and need for interpreter reviewed:: No Do you feel safe going back to the place where you live?: No   Patient feels she needs rehab first before returning back home.  Need for Family Participation in Patient Care: Yes (Comment) Care giver support system in place?: Yes (comment) Current home services: Other (comment)([facility]) Criminal Activity/Legal Involvement Pertinent to Current  Situation/Hospitalization: No - Comment as needed  Activities of Daily Living Home Assistive Devices/Equipment: Walker (specify type) ADL Screening (condition at time of admission) Patient's cognitive ability adequate to safely complete daily activities?: Yes Is the patient deaf or have difficulty hearing?: No Does the patient have difficulty seeing, even when wearing glasses/contacts?: No Does the patient have difficulty concentrating, remembering, or making decisions?: No Patient able to express need for assistance with ADLs?: Yes Does the patient have difficulty dressing or bathing?: Yes Independently performs ADLs?: Yes (appropriate for developmental age) Does the patient have difficulty walking or climbing stairs?: Yes Weakness of Legs: Both Weakness of Arms/Hands: None  Permission Sought/Granted   Permission granted to share information with : Yes, Verbal Permission Granted  Share Information with NAME: Lucillie Garfinkel   [phone] or Gerilyn Nestle Sister   [phone]   Permission granted to share info w AGENCY: SNF admissions  Permission granted to share info w Relationship: [facility] staff  Permission granted to share info w Contact Information: [facility] program staff  Emotional Assessment Appearance:: Appears stated age   Affect (typically observed): Stable, Appropriate, Calm Orientation: : Oriented to Self, Oriented to Place, Oriented to  Time Alcohol / Substance Use: Not Applicable Psych Involvement: No (comment)  Admission diagnosis:  Difficulty Breathing  Patient Active Problem List   Diagnosis Date Noted  . AMS (altered mental status) 02/01/2019  . Hypoglycemia 02/26/2018  . UTI (urinary tract infection) 02/20/2018  . COPD (chronic  obstructive pulmonary disease) (HCC)   . Asthma exacerbation 02/19/2018  . HTN (hypertension) 02/19/2018  . HLD (hyperlipidemia) 02/19/2018  . Diabetes (HCC) 02/19/2018  . Parkinson's disease (HCC) 02/19/2018   PCP:   System, Pcp Not In Pharmacy:   [facility] [place], [place] - [address] [place] [address] Phone: [phone] Fax: [phone]     Social Determinants of Health (SDOH) Interventions    Readmission Risk Interventions 30 Day Unplanned Readmission Risk Score     ED to Hosp-Admission (Current) from 02/01/2019 in [facility] TELEMETRY (2A)  30 Day Unplanned Readmission Risk Score (%)  18 Filed at 02/03/2019 1600     This score is the patient's risk of an unplanned readmission within 30 days of being discharged (0 -100%). The score is based on dignosis, age, lab data, medications, orders, and past utilization.   Low:  0-14.9   Medium: 15-21.9   High: 22-29.9   Extreme: 30 and above       No flowsheet data found.

## 2019-02-03 NOTE — [place] FL2 (Signed)
[place] MEDICAID FL2 LEVEL OF CARE SCREENING TOOL     IDENTIFICATION  Patient Name: Hannah Jackson Birthdate: 1928-07-08 Sex: female Admission Date (Current Location): 02/01/2019  [place] and [place] Number:  Chiropodist and Address:  [facility], [address] [place], [place] [address]      Provider Number: [id number]  Attending Physician Name and Address:  Delfino Lovett, MD  Relative Name and Phone Number:  Lucillie Garfinkel   [phone]     Current Level of Care: Hospital Recommended Level of Care: Skilled Nursing Facility Prior Approval Number:    Date Approved/Denied:   PASRR Number: [id number]  Discharge Plan: SNF    Current Diagnoses: Patient Active Problem List   Diagnosis Date Noted  . AMS (altered mental status) 02/01/2019  . Hypoglycemia 02/26/2018  . UTI (urinary tract infection) 02/20/2018  . COPD (chronic obstructive pulmonary disease) (HCC)   . Asthma exacerbation 02/19/2018  . HTN (hypertension) 02/19/2018  . HLD (hyperlipidemia) 02/19/2018  . Diabetes (HCC) 02/19/2018  . Parkinson's disease (HCC) 02/19/2018    Orientation RESPIRATION BLADDER Height & Weight     Self, Time, Place  O2 Incontinent Weight: 198 lb 3.2 oz (89.9 kg) Height:  5\' 7"  (170.2 cm)  BEHAVIORAL SYMPTOMS/MOOD NEUROLOGICAL BOWEL NUTRITION STATUS      Continent Diet(Heart Healthy Carb Modified)  AMBULATORY STATUS COMMUNICATION OF NEEDS Skin   Limited Assist Verbally Normal                       Personal Care Assistance Level of Assistance  Bathing, Feeding, Dressing Bathing Assistance: Limited assistance Feeding assistance: Independent Dressing Assistance: Limited assistance     Functional Limitations Info  Sight, Hearing, Speech Sight Info: Adequate Hearing Info: Adequate Speech Info: Adequate    SPECIAL CARE FACTORS FREQUENCY  PT (By licensed PT), OT (By licensed OT), Speech therapy     PT Frequency: 5x a week OT  Frequency: 5x a week     Speech Therapy Frequency: 5x a week      Contractures Contractures Info: Not present    Additional Factors Info  Allergies, Code Status, Insulin Sliding Scale Code Status Info: DNR Allergies Info: PENICILLINS   Insulin Sliding Scale Info: insulin aspart (novoLOG) injection 0-9 Units 3x a day with meals.       Current Medications (02/03/2019):  This is the current hospital active medication list Current Facility-Administered Medications  Medication Dose Route Frequency Provider Last Rate Last Dose  . 0.9 %  sodium chloride infusion   Intravenous PRN Delfino Lovett, MD 10 mL/hr at 02/02/19 0729 250 mL at 02/02/19 0729  . acetaminophen (TYLENOL) tablet 650 mg  650 mg Oral Q6H PRN Katha Hamming, MD   650 mg at 02/03/19 3903   Or  . acetaminophen (TYLENOL) suppository 650 mg  650 mg Rectal Q6H PRN Katha Hamming, MD      . aspirin EC tablet 81 mg  81 mg Oral Daily Katha Hamming, MD   81 mg at 02/03/19 0911  . atorvastatin (LIPITOR) tablet 80 mg  80 mg Oral q1800 Delfino Lovett, MD   80 mg at 02/03/19 1758  . bisacodyl (DULCOLAX) EC tablet 5 mg  5 mg Oral Daily PRN Katha Hamming, MD      . budesonide (PULMICORT) nebulizer solution 0.5 mg  0.5 mg Nebulization BID Delfino Lovett, MD   0.5 mg at 02/03/19 0754  . carbidopa-levodopa (SINEMET CR) 50-200 MG per tablet controlled release  1 tablet  1 tablet Oral TID Katha Hamming, MD   1 tablet at 02/03/19 1757  . carbidopa-levodopa (SINEMET IR) 25-100 MG per tablet immediate release 1 tablet  1 tablet Oral TID Delfino Lovett, MD   1 tablet at 02/03/19 1757  . docusate sodium (COLACE) capsule 100 mg  100 mg Oral BID Katha Hamming, MD   100 mg at 02/03/19 0911  . enoxaparin (LOVENOX) injection 40 mg  40 mg Subcutaneous Q24H Katha Hamming, MD   40 mg at 02/02/19 2116  . hydrALAZINE (APRESOLINE) tablet 25 mg  25 mg Oral Q6H Lule, Joana, PA   25 mg at 02/03/19 1757  . insulin aspart (novoLOG)  injection 0-9 Units  0-9 Units Subcutaneous TID WC Katha Hamming, MD   2 Units at 02/03/19 1211  . lactose free nutrition (BOOST PLUS) liquid 237 mL  237 mL Oral TID WC Katha Hamming, MD   237 mL at 02/03/19 1759  . [START ON 02/04/2019] lisinopril (PRINIVIL,ZESTRIL) tablet 10 mg  10 mg Oral Daily Lule, Joana, PA      . metoprolol tartrate (LOPRESSOR) injection 5 mg  5 mg Intravenous Q4H PRN Katha Hamming, MD   5 mg at 02/01/19 1442  . mirabegron ER (MYRBETRIQ) tablet 25 mg  25 mg Oral Daily Katha Hamming, MD   25 mg at 02/03/19 0910  . ondansetron (ZOFRAN) tablet 4 mg  4 mg Oral Q6H PRN Katha Hamming, MD       Or  . ondansetron (ZOFRAN) injection 4 mg  4 mg Intravenous Q6H PRN Katha Hamming, MD      . pregabalin (LYRICA) capsule 75 mg  75 mg Oral QHS Katha Hamming, MD   75 mg at 02/02/19 2115  . sodium chloride flush (NS) 0.9 % injection 3 mL  3 mL Intravenous Q12H Katha Hamming, MD   3 mL at 02/02/19 2117  . zinc oxide (BALMEX) 11.3 % cream 1 application  1 application Topical PRN Katha Hamming, MD         Discharge Medications: Please see discharge summary for a list of discharge medications.  Relevant Imaging Results:  Relevant Lab Results:   Additional Information SSN [id number]  Darleene Cleaver, LCSW

## 2019-02-03 NOTE — Progress Notes (Signed)
[facility] [place] [facility]   PATIENT NAME: Hannah Jackson    MR#:  [id number]  DATE OF BIRTH:  21-Mar-1928  SUBJECTIVE:  CHIEF COMPLAINT:   Chief Complaint  Patient presents with  . Fall  . Respiratory Distress   Increased hydralazine to q 6h, lisinopril to 10 mg. Plan to keep 1 more night for optimal BP control. Patient has no complaints. Feels better today. With sister and nephew at bedside who state she seems better, close to baseline.  REVIEW OF SYSTEMS:  CONSTITUTIONAL: No fever, fatigue or weakness.  EYES: No blurred or double vision.  EARS, NOSE, AND THROAT: No tinnitus or ear pain.  RESPIRATORY: No cough, shortness of breath, wheezing or hemoptysis.  CARDIOVASCULAR: No chest pain, orthopnea, edema.  GASTROINTESTINAL: No nausea, vomiting, diarrhea or abdominal pain. No abdominal discomfort GENITOURINARY: No dysuria, hematuria.  ENDOCRINE: No polyuria, nocturia,  HEMATOLOGY: No anemia, easy bruising or bleeding SKIN: No rash or lesion. MUSCULOSKELETAL:Complains of lower back pain, chronic NEUROLOGIC: No tingling, numbness, weakness. No confusion PSYCHIATRY: No anxiety or depression.  DRUG ALLERGIES:   Allergies  Allergen Reactions  . Penicillins     Has patient had a PCN reaction causing immediate rash, facial/tongue/throat swelling, SOB or lightheadedness with hypotension: Unknown Has patient had a PCN reaction causing severe rash involving mucus membranes or skin necrosis: Unknown Has patient had a PCN reaction that required hospitalization: Unknown Has patient had a PCN reaction occurring within the last 10 years: Unknown If all of the above answers are "NO", then may proceed with Cephalosporin use.    VITALS:  Blood pressure (!) 171/67, pulse 82, temperature 97.9 F (36.6 C), temperature source Oral, resp. rate 20, height 5\' 7"  (1.702 m), weight 89.9 kg, SpO2 100 %. PHYSICAL EXAMINATION:  GENERAL:85 y.o.-year-old patient sitting up  in the chair with no acute distress.  EYES: Pupils equal, round, reactive to light and accommodation. No scleral icterus. Extraocular muscles intact.  HEENT: Head atraumatic, normocephalic. Oropharynx and nasopharynx clear.  NECK: Supple, no jugular venous distention. No thyroid enlargement, no tenderness.  LUNGS: Normal breath sounds bilaterally, no wheezing, rales,rhonchi or crepitation. No use of accessory muscles of respiration.  CARDIOVASCULAR: S1, S2 normal. No murmurs, rubs, or gallops.  ABDOMEN: Soft, nontender, nondistended. Bowel sounds present. No organomegaly or mass.  EXTREMITIES: No pedal edema, cyanosis, or clubbing.  NEUROLOGIC: Cranial nerves II through XII are intact. Muscle strength 5/5 in upper extremities. 4/5 in lower extremities. Sensation intact. Gait not checked. No aphasia noted. PSYCHIATRIC: The patient is alert and oriented x 3 today. SKIN: No obvious rash, lesion, or ulcer.  LABORATORY PANEL:  Female CBC Recent Labs  Lab 02/03/19 0445  WBC 10.6*  HGB 12.6  HCT 41.3  PLT 182   ------------------------------------------------------------------------------------------------------------------ Chemistries  Recent Labs  Lab 02/01/19 1038  02/03/19 0445  NA 138   < > 143  K 4.4   < > 3.9  CL 103   < > 109  CO2 25   < > 25  GLUCOSE 184*   < > 119*  BUN 23   < > 26*  CREATININE 0.72   < > 0.61  CALCIUM 8.6*   < > 8.6*  AST 18  --   --   ALT 9  --   --   ALKPHOS 54  --   --   BILITOT 1.1  --   --    < > = values in this interval not displayed.  RADIOLOGY:  No results found. ASSESSMENT AND PLAN:   85 year old female with history of Parkinson disease, diabetes mellitus type 2, baseline dementia, memory loss brought in because of altered mental status, found on the floor with hypoxia initially was on nonrebreather then switched to 6 L of oxygen and sats are now around 98-100%. CT head showing no acute abnormality, chest x-ray is negative for acute  abnormality, patient found to have elevated white count up to 14.8 with normal kidney function, UA slightly abnormal    1.altered mental status likely secondary to malignant hypertension with BP more than 200s in the emergency room, BP improved, continue beta-blockers, ACE inhibitors, hydralazine, use IV as needed metoprolol as needed today, monitor on telemetry. Initially suspected contribution from UTI following abnormal UA however UC no growth, asymptomatic and afebrile and ciprofloxacin discontinued today.  Increased BP med dosing to better control pressure. Will reassess tomorrow. Clinically improved.  2. 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 Unlikely that this small subcortical infarctcould be contributing to her altered mental status. PT and OT recommend SNF however patient and family prefer her to return to home with [facility] services, will reassess tomorrow and call daughter who is HCPOA. SLP pending -EEG as patient was found with blood around the mouth and concern for seizure as she was found to have decreased responsiveness as well - Per Dr. Amada Jupiter: ThisEEG is consistent with a mild generalized nonspecific cerebral dysfunction (encephalopathy). There was no seizure or seizure predisposition recorded on this study. Please note that lack of epileptiform activity on EEG does not preclude the possibility of epilepsy.   3. Diabetesmellitus type II, patient saw Dr. Tedd Sias in the past. Insulin SSI  4.chronic low back pain continue home meds  5. History of Parkinson disease, follows up with Dr. Malvin Johns from neurology. Continue Sinamet.   6.abnormal UA, was on IV Rocephin. then on Cipro IV. Urine cultures negative. Blood culture no growth > 24 hours. Discontinued antibiotics.   All the records are reviewed and case is discussed with Care Management/Social Worker. Management plans discussed with the patient and/or family and they are in agreement.  CODE STATUS: DNR  TOTAL TIME TAKING CARE OF THIS PATIENT: 30 minutes.   More than 50% of the time was spent in counseling/coordination of care: YES  POSSIBLE D/C IN 1-2 DAYS, DEPENDING ON CLINICAL CONDITION.   Juliza Machnik PA-C on 02/03/2019 at 3:29 PM  Between 7am to 6pm - Pager - [phone]  After 6 pm go to [email or web address] - Social research officer, government  [facility] [place]  Office  [phone]  CC: Primary care physician; System, Pcp Not In  Note: This dictation was prepared with Dragon dictation along with smaller phrase technology. Any transcriptional errors that result from this process are unintentional.

## 2019-02-03 NOTE — Clinical Social Work Note (Signed)
CSW spoke with [place] program, and informed them of the SNF recommendations, CSW was asked to begin bed search for patient.  CSW to continue to follow patient's progress throughout discharge planning.  Hannah Jackson. Hannah Jackson, MSW, LCSW [phone]  02/03/2019 6:22 PM

## 2019-02-03 NOTE — Evaluation (Signed)
Occupational Therapy Evaluation Patient Details Name: Hannah Jackson MRN: [id number] DOB: 09-11-1928 Today's Date: 02/03/2019    History of Present Illness 85 y.o. female with past medical history of Parkinson's disease, dementia, diabetes mellitus, hyperlipidemia, and hypertension, presenting to the ED on 02/01/2019 with altered mental status.  Per ED reports patient lives by herself she was apparently found on the floor yesterday morning by family members. MRI showed Small acute infarct left internal capsule.    Clinical Impression   Pt seen for OT evaluation this date. Prior to hospital admission, pt was living by herself with family and PACE assistance. Aide comes for 2 hrs in the am and 2 hrs in the pm to assist with ADL and meal prep per pt/chart review. Pt was ambulating with a rollator, per chart review. Currently pt demonstrates impairments in BLE strength, balance, activity tolerance, and cognition (increased time to process, initiate movements, following 1 step commands, and verbally communicate -- unclear how much of this is baseline or new). Due to these impairments, pt requires increased assist for functional mobility and ADL tasks. Pt would benefit from skilled OT to address noted impairments and functional limitations (see below for any additional details) in order to maximize safety and independence while minimizing falls risk and caregiver burden.  Upon hospital discharge, recommend pt discharge to STR in order to address noted impairments and functional deficits.     Follow Up Recommendations  SNF    Equipment Recommendations  (TBD by PACE)    Recommendations for Other Services       Precautions / Restrictions Precautions Precautions: Fall Restrictions Weight Bearing Restrictions: No      Mobility Bed Mobility Overal bed mobility: Needs Assistance Bed Mobility: Supine to Sit     Supine to sit: Mod assist     General bed mobility comments: deferred, pt up in  recliner at start and end of session  Transfers Overall transfer level: Needs assistance Equipment used: Rolling walker (2 wheeled) Transfers: Sit to/from Stand Sit to Stand: Mod assist;+2 physical assistance              Balance Overall balance assessment: Needs assistance Sitting-balance support: Feet supported;Bilateral upper extremity supported Sitting balance-Leahy Scale: Poor     Standing balance support: Bilateral upper extremity supported Standing balance-Leahy Scale: Poor                             ADL either performed or assessed with clinical judgement   ADL Overall ADL's : Needs assistance/impaired     Grooming: Sitting;Set up Grooming Details (indicate cue type and reason): cues to initiate Upper Body Bathing: Sitting;Minimal assistance;Moderate assistance   Lower Body Bathing: Sitting/lateral leans;Maximal assistance   Upper Body Dressing : Sitting;Minimal assistance;Moderate assistance   Lower Body Dressing: Sitting/lateral leans;Maximal assistance   Toilet Transfer: BSC;+2 for physical assistance;Moderate assistance;Stand-pivot                   Vision Baseline Vision/History: Wears glasses Wears Glasses: Reading only Patient Visual Report: No change from baseline       Perception     Praxis      Pertinent Vitals/Pain Pain Assessment: No/denies pain     Hand Dominance Right   Extremity/Trunk Assessment Upper Extremity Assessment Upper Extremity Assessment: Generalized weakness(grossly at least 4/5, decreased shoulder flexion ROM 0-100 degr)   Lower Extremity Assessment Lower Extremity Assessment: Generalized weakness(grossly 3/5)   Cervical / Trunk Assessment Cervical /  Trunk Assessment: Kyphotic   Communication Communication Communication: No difficulties   Cognition Arousal/Alertness: Awake/alert Behavior During Therapy: Flat affect Overall Cognitive Status: History of cognitive impairments - at baseline                                  General Comments: follows simple commands with increased time to process, initiate movement, and to verbally communicate, alert and oriented to self, generally to situation ("I fell"), place   General Comments       Exercises     Shoulder Instructions      Home Living Family/patient expects to be discharged to:: Private residence Living Arrangements: Alone Available Help at Discharge: Family;Personal care attendant Type of Home: House Home Access: Ramped entrance     Home Layout: One level     Bathroom Shower/Tub: Producer, television/film/video: Standard     Home Equipment: Environmental consultant - 4 wheels   Additional Comments: Pt reported that she uses her rollator at baseline in home. Aides come for 2 hrs in the morning 2 hours in the PM. Has Pace set up per RN and family.      Prior Functioning/Environment Level of Independence: Needs assistance  Gait / Transfers Assistance Needed: uses rollator at baseline ADL's / Homemaking Assistance Needed: aides and family assist as needed, per pt report she takes a bird bath with her aide   Comments: reported one fall in the last 6 months that led to this admission.        OT Problem List: Decreased strength;Decreased range of motion      OT Treatment/Interventions: Self-care/ADL training;Balance training;Therapeutic exercise;Therapeutic activities;Neuromuscular education;Cognitive remediation/compensation;DME and/or AE instruction;Patient/family education    OT Goals(Current goals can be found in the care plan section) Acute Rehab OT Goals Patient Stated Goal: return to PLOF OT Goal Formulation: With patient Time For Goal Achievement: 02/17/19 Potential to Achieve Goals: Good ADL Goals Pt Will Perform Grooming: with supervision;sitting;with set-up Pt Will Perform Lower Body Dressing: with mod assist;sit to/from stand Pt Will Transfer to Toilet: with min assist;with +2 assist;bedside  commode;ambulating(LRAD for amb)  OT Frequency: Min 2X/week   Barriers to D/C:            Co-evaluation              AM-PAC OT "6 Clicks" Daily Activity     Outcome Measure Help from another person eating meals?: A Little Help from another person taking care of personal grooming?: A Little Help from another person toileting, which includes using toliet, bedpan, or urinal?: A Lot Help from another person bathing (including washing, rinsing, drying)?: A Lot Help from another person to put on and taking off regular upper body clothing?: A Little Help from another person to put on and taking off regular lower body clothing?: A Lot 6 Click Score: 15   End of Session    Activity Tolerance: Patient tolerated treatment well Patient left: in chair;with call bell/phone within reach;with chair alarm set;with SCD's reapplied  OT Visit Diagnosis: Other abnormalities of gait and mobility (R26.89);Muscle weakness (generalized) (M62.81);History of falling (Z91.81);Other symptoms and signs involving cognitive function                Time: 1356-1413 OT Time Calculation (min): 17 min Charges:  OT General Charges $OT Visit: 1 Visit OT Evaluation $OT Eval Moderate Complexity: 1 Mod  Richrd Prime, MPH, MS, OTR/L ascom [phone] 02/03/19, 2:32  PM

## 2019-02-03 NOTE — Evaluation (Signed)
Physical Therapy Evaluation Patient Details Name: Hannah Jackson MRN: [id number] DOB: 20-Jan-1928 Today's Date: 02/03/2019   History of Present Illness  85 y.o. female with past medical history of Parkinson's disease, dementia, diabetes mellitus, hyperlipidemia, and hypertension, presenting to the ED on 02/01/2019 with altered mental status.  Per ED reports patient lives by herself she was apparently found on the floor yesterday morning by family members. MRI showed Small acute infarct left internal capsule.     Clinical Impression  Patient easily woken at start of session, family at bedside to confirm PLOF. Patient oriented to person, place, and situation, needs extended time to process and verbalize. Reported living in one story home with ramp, currently lives alone but has family nearby and aides twice a day (AM/PM) for 2 hrs to assist with ADLs, including feeding. Pt reported ambulating with rollator at baseline in home.  The patient needed AAROM to bilateral LE due to stiffness prior to mobility. Supine to sit with modA1, and step by step instructions. Patient able to sit EOB with bilateral feet and UE support for a few minutes with CGA. Sit <> stand with modAx2 and RW, improvement in balance noted with step by step instructions, transferred to chair ~37ft with RW and modA as well.  Overall the patient demonstrated deficits (see "PT Problem List") that impede the patient's functional abilities, safety, and mobility and would benefit from skilled PT intervention. Recommendation is STR due to decreased caregiver support and current level of assistance needed for mobility.     Follow Up Recommendations SNF    Equipment Recommendations  Other (comment)(TBD)    Recommendations for Other Services OT consult     Precautions / Restrictions Precautions Precautions: Fall Restrictions Weight Bearing Restrictions: No      Mobility  Bed Mobility Overal bed mobility: Needs Assistance Bed  Mobility: Supine to Sit     Supine to sit: Mod assist        Transfers Overall transfer level: Needs assistance Equipment used: Rolling walker (2 wheeled) Transfers: Sit to/from Stand Sit to Stand: Mod assist;+2 physical assistance            Ambulation/Gait Ambulation/Gait assistance: Mod assist;+2 physical assistance Gait Distance (Feet): 2 Feet Assistive device: Rolling walker (2 wheeled) Gait Pattern/deviations: Shuffle     General Gait Details: verbal cues needed for step by step instructions for transfers and small shuffling steps to chair.  Stairs            Wheelchair Mobility    Modified Rankin (Stroke Patients Only)       Balance Overall balance assessment: Needs assistance Sitting-balance support: Feet supported;Bilateral upper extremity supported Sitting balance-Leahy Scale: Poor       Standing balance-Leahy Scale: Poor                               Pertinent Vitals/Pain Pain Assessment: No/denies pain    Home Living Family/patient expects to be discharged to:: Private residence Living Arrangements: Alone Available Help at Discharge: Family;Personal care attendant Type of Home: House Home Access: Ramped entrance     Home Layout: One level Home Equipment: Walker - 4 wheels Additional Comments: Pt reported that she uses her rollator at baseline in home. Aides come for 2 hrs in the morning 2 hours in the PM. Has Pace set up per RN and family.    Prior Function Level of Independence: Needs assistance   Gait / Transfers Assistance  Needed: uses rollator at baseline  ADL's / Homemaking Assistance Needed: aides and family assist as needed  Comments: reported one fall in the last 6 months that led to this admission.     Hand Dominance        Extremity/Trunk Assessment   Upper Extremity Assessment Upper Extremity Assessment: Generalized weakness    Lower Extremity Assessment Lower Extremity Assessment: Generalized  weakness    Cervical / Trunk Assessment Cervical / Trunk Assessment: Kyphotic  Communication   Communication: No difficulties  Cognition Arousal/Alertness: Lethargic Behavior During Therapy: Flat affect Overall Cognitive Status: History of cognitive impairments - at baseline                                        General Comments      Exercises     Assessment/Plan    PT Assessment Patient needs continued PT services  PT Problem List Decreased strength;Decreased range of motion;Decreased activity tolerance;Decreased knowledge of use of DME;Decreased balance;Decreased safety awareness;Decreased mobility;Decreased knowledge of precautions       PT Treatment Interventions DME instruction;Therapeutic exercise;Gait training;Balance training;Stair training;Neuromuscular re-education;Functional mobility training;Therapeutic activities;Patient/family education    PT Goals (Current goals can be found in the Care Plan section)  Acute Rehab PT Goals Patient Stated Goal: return to PLOF PT Goal Formulation: With family Time For Goal Achievement: 02/17/19 Potential to Achieve Goals: Good    Frequency 7X/week   Barriers to discharge Decreased caregiver support      Co-evaluation               AM-PAC PT "6 Clicks" Mobility  Outcome Measure Help needed turning from your back to your side while in a flat bed without using bedrails?: A Lot Help needed moving from lying on your back to sitting on the side of a flat bed without using bedrails?: A Lot Help needed moving to and from a bed to a chair (including a wheelchair)?: A Lot Help needed standing up from a chair using your arms (e.g., wheelchair or bedside chair)?: A Lot Help needed to walk in hospital room?: A Lot Help needed climbing 3-5 steps with a railing? : Total 6 Click Score: 11    End of Session Equipment Utilized During Treatment: Gait belt Activity Tolerance: Patient limited by fatigue Patient  left: with chair alarm set;in chair;with SCD's reapplied;with call bell/phone within reach Nurse Communication: Mobility status PT Visit Diagnosis: Difficulty in walking, not elsewhere classified (R26.2);Muscle weakness (generalized) (M62.81);Other abnormalities of gait and mobility (R26.89)    Time: 3500-9381 PT Time Calculation (min) (ACUTE ONLY): 43 min   Charges:   PT Evaluation $PT Eval Moderate Complexity: 1 Mod PT Treatments $Therapeutic Activity: 23-37 mins        Olga Coaster PT, DPT 10:44 AM,02/03/19 [phone]

## 2019-02-04 LAB — BASIC METABOLIC PANEL
Anion gap: 7 (ref 5–15)
BUN: 25 mg/dL — ABNORMAL HIGH (ref 8–23)
CO2: 26 mmol/L (ref 22–32)
Calcium: 8.3 mg/dL — ABNORMAL LOW (ref 8.9–10.3)
Chloride: 108 mmol/L (ref 98–111)
Creatinine, Ser: 0.46 mg/dL (ref 0.44–1.00)
GFR calc Af Amer: >60 mL/min (ref >60)
GFR calc non Af Amer: >60 mL/min (ref >60)
GLUCOSE: 119 mg/dL — AB (ref 70–99)
Potassium: 3.6 mmol/L (ref 3.5–5.1)
Sodium: 141 mmol/L (ref 135–145)

## 2019-02-04 LAB — CBC
HCT: 38.8 % (ref 36.0–46.0)
HEMOGLOBIN: 12.1 g/dL (ref 12.0–15.0)
MCH: 28.6 pg (ref 26.0–34.0)
MCHC: 31.2 g/dL (ref 30.0–36.0)
MCV: 91.7 fL (ref 80.0–100.0)
Platelets: 185 10*3/uL (ref 150–400)
RBC: 4.23 MIL/uL (ref 3.87–5.11)
RDW: 12.7 % (ref 11.5–15.5)
WBC: 8.9 10*3/uL (ref 4.0–10.5)
nRBC: 0 % (ref 0.0–0.2)

## 2019-02-04 LAB — GLUCOSE, CAPILLARY
Glucose-Capillary: 110 mg/dL — ABNORMAL HIGH (ref 70–99)
Glucose-Capillary: 144 mg/dL — ABNORMAL HIGH (ref 70–99)

## 2019-02-04 MED ORDER — CALCIUM CARBONATE ANTACID 500 MG PO CHEW
1.0000 | CHEWABLE_TABLET | Freq: Every day | ORAL | Status: DC
  Administered 2019-02-04: 200 mg via ORAL
  Filled 2019-02-04 (×2): qty 1

## 2019-02-04 MED ORDER — CALCIUM CARBONATE ANTACID 500 MG PO CHEW: 1.0000 | CHEWABLE_TABLET | ORAL | 0 refills | Status: DC | PRN

## 2019-02-04 MED ORDER — ATORVASTATIN CALCIUM 80 MG PO TABS: 80.0000 mg | ORAL_TABLET | Freq: Every day | ORAL | 0 refills | Status: DC

## 2019-02-04 MED ORDER — HYDRALAZINE HCL 25 MG PO TABS: 25.0000 mg | ORAL_TABLET | Freq: Four times a day (QID) | ORAL | 0 refills | Status: DC

## 2019-02-04 MED ORDER — LISINOPRIL 10 MG PO TABS: 10.0000 mg | ORAL_TABLET | Freq: Every day | ORAL | 0 refills | Status: DC

## 2019-02-04 MED ORDER — FAMOTIDINE 20 MG PO TABS
20.0000 mg | ORAL_TABLET | Freq: Every day | ORAL | Status: DC
  Administered 2019-02-04: 20 mg via ORAL
  Filled 2019-02-04: qty 1

## 2019-02-04 NOTE — Progress Notes (Signed)
Physical Therapy Treatment Patient Details Name: Hannah Jackson MRN: [id number] DOB: [birth year] Today's Date: 02/04/2019    History of Present Illness 85 y.o. female with past medical history of Parkinson's disease, dementia, diabetes mellitus, hyperlipidemia, and hypertension, presenting to the ED on 02/01/2019 with altered mental status.  Per ED reports patient lives by herself she was apparently found on the floor yesterday morning by family members. MRI showed Small acute infarct left internal capsule.     PT Comments    Pt in bed, initially not excited to get up but agrees with encouragement.  To edge of bed with mod a x 1.  Generally stiff trunk with little to no rotation and difficulty reaching rails.  Once sitting, edge of bed, she is able to sit without support and stated she felt safe but she has little to no trunk rotation or UE mobility again in sitting which would put her at risk for falls if she had a LOB.  Stood with min a a x 2 walker.  She was able to turn to recliner with slow steps then walk 5' forward along bed with min a x 1.  She did fatigue and reported some dizziness that was relieved in sitting.   Follow Up Recommendations  SNF     Equipment Recommendations       Recommendations for Other Services       Precautions / Restrictions Precautions Precautions: Fall Restrictions Weight Bearing Restrictions: No    Mobility  Bed Mobility Overal bed mobility: Needs Assistance Bed Mobility: Supine to Sit     Supine to sit: Mod assist        Transfers Overall transfer level: Needs assistance Equipment used: Rolling walker (2 wheeled) Transfers: Sit to/from Stand              Ambulation/Gait Ambulation/Gait assistance: Min assist;+2 physical assistance Gait Distance (Feet): 6 Feet Assistive device: Rolling walker (2 wheeled) Gait Pattern/deviations: Step-through pattern;Decreased step length - right;Decreased step length - left;Wide base of  support;Shuffle Gait velocity: decreased       Stairs             Wheelchair Mobility    Modified Rankin (Stroke Patients Only)       Balance Overall balance assessment: Needs assistance Sitting-balance support: Feet supported;Bilateral upper extremity supported Sitting balance-Leahy Scale: Poor Sitting balance - Comments: able to sit statically without assist but generally "stiff" with poor righting reactions   Standing balance support: Bilateral upper extremity supported Standing balance-Leahy Scale: Poor                              Cognition Arousal/Alertness: Awake/alert Behavior During Therapy: WFL for tasks assessed/performed Overall Cognitive Status: History of cognitive impairments - at baseline                                        Exercises      General Comments        Pertinent Vitals/Pain Pain Assessment: No/denies pain    Home Living                      Prior Function            PT Goals (current goals can now be found in the care plan section) Progress towards PT goals: Progressing toward goals  Frequency    7X/week      PT Plan Current plan remains appropriate    Co-evaluation              AM-PAC PT "6 Clicks" Mobility   Outcome Measure  Help needed turning from your back to your side while in a flat bed without using bedrails?: A Lot Help needed moving from lying on your back to sitting on the side of a flat bed without using bedrails?: A Lot Help needed moving to and from a bed to a chair (including a wheelchair)?: A Lot Help needed standing up from a chair using your arms (e.g., wheelchair or bedside chair)?: A Lot Help needed to walk in hospital room?: A Lot Help needed climbing 3-5 steps with a railing? : Total 6 Click Score: 11    End of Session Equipment Utilized During Treatment: Gait belt Activity Tolerance: Patient limited by fatigue Patient left: with chair  alarm set;in chair;with call bell/phone within reach Nurse Communication: Mobility status       Time: 1040-1056 PT Time Calculation (min) (ACUTE ONLY): 16 min  Charges:  $Gait Training: 8-22 mins                     Danielle Dess, PTA 02/04/19, 1:17 PM

## 2019-02-04 NOTE — Discharge Summary (Signed)
[facility] [place] at [facility]   PATIENT NAME: Hannah Jackson    MR#:  [id number]  DATE OF BIRTH:  03/20/1928  DATE OF ADMISSION:  02/01/2019   ADMITTING PHYSICIAN: Katha Hamming, MD  DATE OF DISCHARGE: 02/04/2019  PRIMARY CARE PHYSICIAN: System, Pcp Not In   ADMISSION DIAGNOSIS:  Difficulty Breathing  DISCHARGE DIAGNOSIS:  Active Problems:   AMS (altered mental status)  SECONDARY DIAGNOSIS:   Past Medical History:  Diagnosis Date  . Asthma   . Diabetes mellitus without complication (HCC)    on metformin  . HLD (hyperlipidemia)   . HTN (hypertension)   . Parkinson's disease [facility])    HOSPITAL COURSE:   85 year old female with history of Parkinson disease, diabetes mellitus type 2, baseline dementia, memory loss brought in because of altered mental status, found on the floor with hypoxia initially was on nonrebreather then switched to 6 L of oxygen and sats are now around 98-100%. On Room air. CT head showing no acute abnormality, chest x-ray is negative for acute abnormality, patient found to have elevated white count up to 14.8 with normal kidney function, UA slightly abnormal. Admitted for management of the following:   1.Altered mental status Malignant Hypertension on presentation likely secondary to malignant hypertension with BP more than 200s in the emergency room. BP improved, continue beta-blockers, ACE inhibitors, hydralazine, used IV as needed metoprolol inpatient, monitored on telemetry. Increased dose of Lisinopril to 10 mg daily. Added hydralazine 25 mg daily. Blood pressures better controlled inpatient on this regimen. Continue, Defer to [facility] for future changes.  Recommend BMP monitoring outpatient. Initially thought there could be contribution to mental status from her UTI. See below. Low suspicion for UTI at discharge.  2. Metabolic encephalopathy  getting better, much improved on discharge. CT head unremarkable but did  do MRI of the brain to evaluate for acute stroke - small acute infarct of the internal capsule found,  -Neuro consulted - continue aspirin, statin added, PT/OT/SLP to evaluate: recommending SNF and SLP saw no barriers to safe swallowing/patient able to feed herself, no further stroke workup needed at this time per Neuro - Per SW Minerva Areola who [facility] will not be pursuing SNF placement given risks of COVID-19 in the community. [facility] tells Minerva Areola they will train the family in caring for the patient at home, will set up Home health therapy on their own. Per Minerva Areola, defer this to [facility] no HH orders are necessary - Etiology likely small vessel disease in the setting of malignant hypertension with elevated blood pressure greater than 200s on presentation. Unlikely that this small subcortical infarctcould be contributing to her altered mental status. -EEG as patient was found with blood around the mouth and concern for seizure as she was found to have decreased responsiveness as well - Per Dr. Amada Jupiter: ThisEEG is consistent with a mild generalized nonspecific cerebral dysfunction (encephalopathy). There was no seizure or seizure predisposition recorded on this study. Please note that lack of epileptiform activity on EEG does not preclude the possibility of epilepsy. Per Neuro no need to initiate anti-convulsant.  Blood around mouth likely incidental to oropharynx tissue injury due to fall.   3. Diabetesmellitus type II, patient saw Dr. Tedd Sias in the past. Insulin SSI. Continue home meds.  4.Chronic low back pain continue home meds   5. History of Parkinson's disease, follows up with Dr. Malvin Johns from neurology. Continue Sinamet. Continue regular follow-up with Dr. Malvin Johns.  6.Abnormal UA, was on IV Rocephin empirically. Then on Cipro IV. Urine cultures no growth. Blood culture no growth. No need for further antibiotic treatment. Low suspicion for cystitis at discharge.  Afebrile with WBC within normal limits  7. GERD Continue home meds. Sit up after meals to minimize reflux symptoms.  Complained of some clear frothy sputum with epigastric discomfort on day of discharge.  PRN TUMS as needed for breakthrough heartburn/reflux.   DISCHARGE CONDITIONS:  stable CONSULTS OBTAINED:  Treatment Team:  Kym Groom, MD DRUG ALLERGIES:   Allergies  Allergen Reactions  . Penicillins     Has patient had a PCN reaction causing immediate rash, facial/tongue/throat swelling, SOB or lightheadedness with hypotension: Unknown Has patient had a PCN reaction causing severe rash involving mucus membranes or skin necrosis: Unknown Has patient had a PCN reaction that required hospitalization: Unknown Has patient had a PCN reaction occurring within the last 10 years: Unknown If all of the above answers are "NO", then may proceed with Cephalosporin use.    DISCHARGE MEDICATIONS:   Allergies as of 02/04/2019      Reactions   Penicillins    Has patient had a PCN reaction causing immediate rash, facial/tongue/throat swelling, SOB or lightheadedness with hypotension: Unknown Has patient had a PCN reaction causing severe rash involving mucus membranes or skin necrosis: Unknown Has patient had a PCN reaction that required hospitalization: Unknown Has patient had a PCN reaction occurring within the last 10 years: Unknown If all of the above answers are "NO", then may proceed with Cephalosporin use.      Medication List    TAKE these medications   acetaminophen 650 MG CR tablet Commonly known as:  TYLENOL Take 650 mg by mouth 2 (two) times daily. For arthritis pain/stiffness   albuterol (2.5 MG/3ML) 0.083% nebulizer solution Commonly known as:  PROVENTIL Take 2.5 mg by nebulization every 6 (six) hours as needed for wheezing or shortness of breath.   aspirin EC 81 MG tablet Take 81 mg by mouth daily.   atorvastatin 80 MG tablet Commonly known as:  LIPITOR Take  1 tablet (80 mg total) by mouth daily at 6 PM.   benzonatate 100 MG capsule Commonly known as:  TESSALON Take 100 mg by mouth 3 (three) times daily as needed for cough.   budesonide 0.5 MG/2ML nebulizer solution Commonly known as:  PULMICORT Take 0.5 mg by nebulization 2 (two) times daily. What changed:  Another medication with the same name was removed. Continue taking this medication, and follow the directions you see here.   calcium carbonate 500 MG chewable tablet Commonly known as:  TUMS - dosed in mg elemental calcium Chew 1 tablet (200 mg of elemental calcium total) by mouth as needed for indigestion or heartburn.   carbidopa-levodopa 25-100 MG tablet Commonly known as:  SINEMET IR Take 1 tablet by mouth 3 (three) times daily.   Sinemet CR 50-200 MG tablet Generic drug:  carbidopa-levodopa Take 1 tablet by mouth 3 (three) times daily.   cholecalciferol 1000 units tablet Commonly known as:  VITAMIN D Take 1,000 Units by mouth daily.   diclofenac sodium 1 % Gel Commonly known as:  VOLTAREN Apply 2 g topically 3 (three) times daily as needed (for wrist pain).   glucose 4 GM chewable tablet Chew 1 tablet (4 g total) by mouth 2 (two) times daily.   guaiFENesin 600 MG 12 hr tablet Commonly known as:  MUCINEX Take 600 mg by mouth 2 (two) times daily as  needed for to loosen phlegm.   guaiFENesin-dextromethorphan 100-10 MG/5ML syrup Commonly known as:  ROBITUSSIN DM Take 5 mLs by mouth every 4 (four) hours as needed for cough.   hydrALAZINE 25 MG tablet Commonly known as:  APRESOLINE Take 1 tablet (25 mg total) by mouth every 6 (six) hours for 30 days.   ibandronate 150 MG tablet Commonly known as:  BONIVA Take 150 mg by mouth every 30 (thirty) days. Take in the morning with a full glass of water, on an empty stomach, and do not take anything else by mouth or lie down for the next 60 min.   lactose free nutrition Liqd Take 237 mLs by mouth 3 (three) times daily with  meals.   lisinopril 10 MG tablet Commonly known as:  PRINIVIL,ZESTRIL Take 1 tablet (10 mg total) by mouth daily. Start taking on:  February 05, 2019 What changed:    medication strength  how much to take   Myrbetriq 25 MG Tb24 tablet Generic drug:  mirabegron ER Take 25 mg by mouth daily.   OPTIVE 0.5-0.9 % ophthalmic solution Generic drug:  carboxymethylcellul-glycerin Place 1-2 drops into both eyes 4 (four) times daily as needed for dry eyes.   pregabalin 75 MG capsule Commonly known as:  LYRICA Take 75 mg by mouth at bedtime.   ranitidine 150 MG tablet Commonly known as:  ZANTAC Take 150 mg by mouth 2 (two) times daily.   senna-docusate 8.6-50 MG tablet Commonly known as:  Senokot-S Take 1 tablet by mouth daily.   Zinc Oxide 13 % Crea Apply 1 application topically as needed for irritation (apply cream to buttocks three times daily and as needed as a barrier cream).        DISCHARGE INSTRUCTIONS:   DIET:  Regular diet DISCHARGE CONDITION:  Stable ACTIVITY:  Activity as tolerated OXYGEN:  Home Oxygen: No.  Oxygen Delivery: room air DISCHARGE LOCATION:  Home with [facility] home health services and family training. SNF was not pursued at this time, Per SW Minerva Areola at [employer].    If you experience worsening of your admission symptoms, develop shortness of breath, life threatening emergency, suicidal or homicidal thoughts you must seek medical attention immediately by calling 911 or calling your MD immediately if your symptoms are severe.  You Must read complete instructions/literature along with all the possible adverse reactions/side effects for all the medicines you take and that have been prescribed to you. Take any new medicines only after you have completely understood and accept all the possible adverse reactions/side effects.   Please note  You were cared for by a hospitalist during your hospital stay. If you have any questions about your  discharge medications or the care you received while you were in the hospital after you are discharged, you can call the unit and asked to speak with the hospitalist on call if the hospitalist that took care of you is not available. Once you are discharged, your primary care physician will handle any further medical issues. Please note that NO REFILLS for any discharge medications will be authorized once you are discharged, as it is imperative that you return to your primary care physician (or establish a relationship with a primary care physician if you do not have one) for your aftercare needs so that they can reassess your need for medications and monitor your lab values.    On the day of Discharge:  VITAL SIGNS:  Blood pressure (!) 148/59, pulse 85, temperature 98.4 F (  36.9 C), temperature source Oral, resp. rate 20, height 5\' 7"  (1.702 m), weight 90.7 kg, SpO2 96 %. PHYSICAL EXAMINATION:  GENERAL:85 y.o.-year-old patient sitting up in the chair with no acute distress.  EYES: Pupils equal, round, reactive to light and accommodation. No scleral icterus. Extraocular muscles intact.  HEENT: Head atraumatic, normocephalic. Oropharynx and nasopharynx clear.  NECK: Supple, no jugular venous distention. No thyroid enlargement, no tenderness.  LUNGS: Normal breath sounds bilaterally, no wheezing, rales,rhonchi or crepitation. No use of accessory muscles of respiration.  CARDIOVASCULAR: S1, S2 normal. No murmurs, rubs, or gallops.  ABDOMEN: Soft, nontender, nondistended. Bowel sounds present. No organomegaly or mass.  EXTREMITIES: No pedal edema, cyanosis, or clubbing.  NEUROLOGIC: Cranial nerves II through XII are intact. Muscle strength 5/5 inupperextremities.4/5 in lower extremities.Sensation intact. Gait not checked. No aphasia noted. PSYCHIATRIC: The patient is alert and orientedx 3 today. SKIN: No obvious rash, lesion, or ulcer. DATA REVIEW:   CBC Recent Labs  Lab 02/04/19 0440   WBC 8.9  HGB 12.1  HCT 38.8  PLT 185    Chemistries  Recent Labs  Lab 02/01/19 1038  02/04/19 0440  NA 138   < > 141  K 4.4   < > 3.6  CL 103   < > 108  CO2 25   < > 26  GLUCOSE 184*   < > 119*  BUN 23   < > 25*  CREATININE 0.72   < > 0.46  CALCIUM 8.6*   < > 8.3*  AST 18  --   --   ALT 9  --   --   ALKPHOS 54  --   --   BILITOT 1.1  --   --    < > = values in this interval not displayed.     Microbiology Results  Results for orders placed or performed during the hospital encounter of 02/01/19  Culture, blood (Routine x 2)     Status: None (Preliminary result)   Collection Time: 02/01/19 10:33 AM  Result Value Ref Range Status   Specimen Description BLOOD RFA  Final   Special Requests   Final    BOTTLES DRAWN AEROBIC AND ANAEROBIC Blood Culture adequate volume   Culture   Final    NO GROWTH 3 DAYS Performed at [facility], [address] [place], [place] [address]    Report Status PENDING  Incomplete  Culture, blood (Routine x 2)     Status: None (Preliminary result)   Collection Time: 02/01/19 10:43 AM  Result Value Ref Range Status   Specimen Description BLOOD LAC  Final   Special Requests   Final    BOTTLES DRAWN AEROBIC AND ANAEROBIC Blood Culture results may not be optimal due to an inadequate volume of blood received in culture bottles   Culture   Final    NO GROWTH 3 DAYS Performed at [facility], [address], [place], [place] [address]    Report Status PENDING  Incomplete  Urine culture     Status: None   Collection Time: 02/01/19 12:00 PM  Result Value Ref Range Status   Specimen Description   Final    URINE, RANDOM Performed at [facility], [address] [place], [place] [address]    Special Requests   Final    NONE Performed at [facility], [address], [place], [place] [address]    Culture   Final    NO GROWTH Performed at [facility], [address] [place], [place] [address]     Report Status 02/03/2019 FINAL  Final    RADIOLOGY:  No results found.   Management plans discussed with the patient and/or family and they are in agreement.  CODE STATUS: DNR   TOTAL TIME TAKING CARE OF THIS PATIENT: 45 minutes.    Marie Chow PA-C on 02/04/2019 at 1:34 PM  Between 7am to 6pm - Pager - [phone]  After 6pm go to [email or web address] - Social research officer, government  [facility] [place] [facility]  Office  [phone]  CC: Primary care physician; System, Pcp Not In

## 2019-02-04 NOTE — Progress Notes (Signed)
Discussed discharge plan with Berniece Andreas.  All questions addressed.  Discharge paperwork to accompany pt during transport home with [facility].

## 2019-02-04 NOTE — Plan of Care (Signed)
Pt ready for discharge.   Problem: Education: Goal: Knowledge of General Education information will improve Description: Including pain rating scale, medication(s)/side effects and non-pharmacologic comfort measures Outcome: Completed/Met   Problem: Health Behavior/Discharge Planning: Goal: Ability to manage health-related needs will improve Outcome: Completed/Met   Problem: Clinical Measurements: Goal: Ability to maintain clinical measurements within normal limits will improve Outcome: Completed/Met Goal: Will remain free from infection Outcome: Completed/Met Goal: Diagnostic test results will improve Outcome: Completed/Met Goal: Respiratory complications will improve Outcome: Completed/Met Goal: Cardiovascular complication will be avoided Outcome: Completed/Met   Problem: Activity: Goal: Risk for activity intolerance will decrease Outcome: Completed/Met   Problem: Nutrition: Goal: Adequate nutrition will be maintained Outcome: Completed/Met   Problem: Coping: Goal: Level of anxiety will decrease Outcome: Completed/Met   Problem: Elimination: Goal: Will not experience complications related to bowel motility Outcome: Completed/Met Goal: Will not experience complications related to urinary retention Outcome: Completed/Met   Problem: Pain Managment: Goal: General experience of comfort will improve Outcome: Completed/Met   Problem: Safety: Goal: Ability to remain free from injury will improve Outcome: Completed/Met   Problem: Skin Integrity: Goal: Risk for impaired skin integrity will decrease Outcome: Completed/Met   

## 2019-02-04 NOTE — Progress Notes (Signed)
SLP Cancellation Note  Patient Details Name: Hannah Jackson MRN: [id number] DOB: 08-Sep-1928   Cancelled treatment:       Reason Eval/Treat Not Completed: SLP screened, no needs identified, will sign off(reviewed chart notes. Consulted PA, MD. ). Pt has a baseline h/o Parkinson's Disease and Dementia both of which can impact Cognitive-Linguistic abilities. Pt was answering basic y/n questions given time/cues at times then following basic 1 step commands - suspect pt could be close to/at her baseline. No family present to determine baseline functioning. Recommend f/u post discharge IF any new deficits are noted. PA, MD agreed. NSG updated.    Jerilynn Som, MS, CCC-SLP Larua Collier 02/04/2019, 2:41 PM

## 2019-02-04 NOTE — Evaluation (Signed)
Clinical/Bedside Swallow Evaluation Patient Details  Name: Hannah Jackson MRN: [id number] Date of Birth: 03/28/1928  Today's Date: 02/04/2019 Time: SLP Start Time (ACUTE ONLY): 0930 SLP Stop Time (ACUTE ONLY): 1025 SLP Time Calculation (min) (ACUTE ONLY): 55 min  Past Medical History:  Past Medical History:  Diagnosis Date  . Asthma   . Diabetes mellitus without complication (HCC)    on metformin  . HLD (hyperlipidemia)   . HTN (hypertension)   . Parkinson's disease [facility])    Past Surgical History:  Past Surgical History:  Procedure Laterality Date  . BACK SURGERY     HPI:  Hannah Jackson is an 85 y.o. female with past medical history of Parkinson's Disease, Dementia, diabetes mellitus, hyperlipidemia, and hypertension, presenting to the ED on 02/01/2019 with altered mental status.  Per ED reports patient lives by herself she was apparently found on the floor yesterday morning by family members. On arrival to the ED, she was noted to have dried blood to the left side of her face possibly coming from her mouth.  Per EMS her initial sats were in the 50s therefore she was placed on a nonrebreather at 15 L.  She was afebrile with blood pressure 208/122 mm Hg and pulse rate 105 beats/min. There were no focal neurological deficits; she was however somnolent, not awaken to painful stimuli and will occasionally respond to voice after multiple attempts.  Patient seemed confused and could not answer questions.  Patient labs revealed lactic acid levels of 1.5, CK 341, WBC 14.8, blood glucose 184, urinalysis positive for UTI. ECG showed sinus tachycardia of 104 beats per minute and the chest X-ray was normal. A non-contrast head CT showed no acute intracranial abnormality.  Patient was therefore admitted for further work-up and management.  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  Due to Hannah Jackson's baseline of Dementia and acute illness, unsure if Hannah Jackson's  reduced verbal communication is baseline or not. Given time and min cues, Hannah Jackson was able to answer basic y/n questions and follow basic commands. She is followed by the [facility] program.    Assessment / Plan / Recommendation Clinical Impression  Hannah Jackson appears to present w/ an adequate oropharyngeal phase swallow function w/ reduced risk for aspiration when following general aspiration precautions; Hannah Jackson required some assistance w/ encouragement  and setup/preparation of the tray/meal in order to feed self. Hannah Jackson consumed trials of thin liquids via cup/straw w/ no overt s/s of aspiration noted; no decline in vocal quality when she spoke, no decline in respiratory status during/post trials. Oral phase was grossly wfl w/ bolus management of all po trials. No anterior leakage or spillage; oral clearing appropriate. Hannah Jackson required assistance w/ feeding, encouragement to feed self. Suspect this is d/t baseline Dementia. Informal OM exam appeared grossly wfl. Recommend continue current diet w/ cut, easy to eat foods; thin liquids. General aspiration precautions; Pills in puree for safer swallowing - check for oral clearing/swallowing of pills d/t baseline Dementia. Hannah Jackson appears at her baseline w/ swallowing at this time. Hannah Jackson did c/o mid-sternum discomfort w/ belching noted - could be s/s of REFLUX. PA made aware and is ordering a PPI.  SLP Visit Diagnosis: Dysphagia, unspecified (R13.10)    Aspiration Risk  (reduced following general aspiration precautions)    Diet Recommendation  Regular diet w/ cut, moist foods; Thin liquids. General aspiration precautions; support at meals  Medication Administration: Whole meds with puree(for safer swallowing)  Other  Recommendations Recommended Consults: (Dietician f/u) Oral Care Recommendations: Oral care BID;Staff/trained caregiver to provide oral care Other Recommendations: (n/a)   Follow up Recommendations None      Frequency and Duration (n/a)  (n/a)       Prognosis Prognosis  for Safe Diet Advancement: Good Barriers to Reach Goals: Cognitive deficits      Swallow Study   General Date of Onset: 02/01/19 HPI: Hannah Jackson is an 85 y.o. female with past medical history of Parkinson's Disease, Dementia, diabetes mellitus, hyperlipidemia, and hypertension, presenting to the ED on 02/01/2019 with altered mental status.  Per ED reports patient lives by herself she was apparently found on the floor yesterday morning by family members. On arrival to the ED, she was noted to have dried blood to the left side of her face possibly coming from her mouth.  Per EMS her initial sats were in the 50s therefore she was placed on a nonrebreather at 15 L.  She was afebrile with blood pressure 208/122 mm Hg and pulse rate 105 beats/min. There were no focal neurological deficits; she was however somnolent, not awaken to painful stimuli and will occasionally respond to voice after multiple attempts.  Patient seemed confused and could not answer questions.  Patient labs revealed lactic acid levels of 1.5, CK 341, WBC 14.8, blood glucose 184, urinalysis positive for UTI. ECG showed sinus tachycardia of 104 beats per minute and the chest X-ray was normal. A non-contrast head CT showed no acute intracranial abnormality.  Patient was therefore admitted for further work-up and management.  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  Due to Hannah Jackson's baseline of Dementia and acute illness, unsure if Hannah Jackson's reduced verbal communication is baseline or not. Given time and min cues, Hannah Jackson was able to answer basic y/n questions and follow basic commands. She is followed by the [facility] program.  Type of Study: Bedside Swallow Evaluation Previous Swallow Assessment: none reported Diet Prior to this Study: Regular;Thin liquids Temperature Spikes Noted: No(wbc 8.9) Respiratory Status: Room air History of Recent Intubation: No Behavior/Cognition: Pleasant  mood;Confused;Cooperative;Distractible;Requires cueing(Awake) Oral Cavity Assessment: Within Functional Limits Oral Care Completed by SLP: Recent completion by staff Oral Cavity - Dentition: Adequate natural dentition Vision: Functional for self-feeding Self-Feeding Abilities: Able to feed self;Needs assist;Needs set up;Total assist(did not feed self drinks) Patient Positioning: Upright in bed Baseline Vocal Quality: Low vocal intensity(mumbled speech) Volitional Cough: Cognitively unable to elicit Volitional Swallow: Unable to elicit    Oral/Motor/Sensory Function Overall Oral Motor/Sensory Function: Within functional limits(no unilateral weakness noted during movements)   Ice Chips Ice chips: Not tested   Thin Liquid Thin Liquid: Within functional limits Presentation: Self Fed;Cup;Straw(assisted; ~4 ozs)    Nectar Thick Nectar Thick Liquid: Not tested   Honey Thick Honey Thick Liquid: Not tested   Puree Puree: Within functional limits Presentation: Spoon(fed; 5 trials)   Solid     Solid: Within functional limits(grossly w/ mech soft trials) Presentation: Spoon(fed; 3 trials)       Jerilynn Som, MS, CCC-SLP Zeyna Mkrtchyan 02/04/2019,3:10 PM

## 2019-02-06 LAB — CULTURE, BLOOD (ROUTINE X 2)
CULTURE: NO GROWTH
Culture: NO GROWTH
Special Requests: ADEQUATE

## 2019-04-10 ENCOUNTER — Inpatient Hospital Stay
Admission: EM | Admit: 2019-04-10 | Discharge: 2019-04-12 | DRG: 379 | Disposition: A | Discharge status: Home / Self Care | Payer: Medicare (Managed Care) | Attending: Internal Medicine | Admitting: Internal Medicine

## 2019-04-10 ENCOUNTER — Encounter: Payer: Self-pay | Admitting: Emergency Medicine

## 2019-04-10 ENCOUNTER — Other Ambulatory Visit: Payer: Self-pay

## 2019-04-10 ENCOUNTER — Emergency Department: Payer: Medicare (Managed Care)

## 2019-04-10 DIAGNOSIS — G20 Parkinson's disease: Secondary | ICD-10-CM | POA: Diagnosis present

## 2019-04-10 DIAGNOSIS — Z1159 Encounter for screening for other viral diseases: Secondary | ICD-10-CM | POA: Diagnosis not present

## 2019-04-10 DIAGNOSIS — Z8249 Family history of ischemic heart disease and other diseases of the circulatory system: Secondary | ICD-10-CM | POA: Diagnosis not present

## 2019-04-10 DIAGNOSIS — Z7982 Long term (current) use of aspirin: Secondary | ICD-10-CM | POA: Diagnosis not present

## 2019-04-10 DIAGNOSIS — J45909 Unspecified asthma, uncomplicated: Secondary | ICD-10-CM | POA: Diagnosis present

## 2019-04-10 DIAGNOSIS — K921 Melena: Secondary | ICD-10-CM | POA: Diagnosis not present

## 2019-04-10 DIAGNOSIS — R040 Epistaxis: Secondary | ICD-10-CM

## 2019-04-10 DIAGNOSIS — F0280 Dementia in other diseases classified elsewhere without behavioral disturbance: Secondary | ICD-10-CM | POA: Diagnosis present

## 2019-04-10 DIAGNOSIS — K922 Gastrointestinal hemorrhage, unspecified: Secondary | ICD-10-CM | POA: Diagnosis present

## 2019-04-10 DIAGNOSIS — E119 Type 2 diabetes mellitus without complications: Secondary | ICD-10-CM | POA: Diagnosis present

## 2019-04-10 DIAGNOSIS — Z880 Allergy status to penicillin: Secondary | ICD-10-CM

## 2019-04-10 DIAGNOSIS — Z7951 Long term (current) use of inhaled steroids: Secondary | ICD-10-CM | POA: Diagnosis not present

## 2019-04-10 DIAGNOSIS — Z79899 Other long term (current) drug therapy: Secondary | ICD-10-CM

## 2019-04-10 DIAGNOSIS — Z806 Family history of leukemia: Secondary | ICD-10-CM

## 2019-04-10 DIAGNOSIS — Z823 Family history of stroke: Secondary | ICD-10-CM

## 2019-04-10 DIAGNOSIS — E785 Hyperlipidemia, unspecified: Secondary | ICD-10-CM | POA: Diagnosis present

## 2019-04-10 DIAGNOSIS — I10 Essential (primary) hypertension: Secondary | ICD-10-CM | POA: Diagnosis present

## 2019-04-10 LAB — CBC
HCT: 43.5 % (ref 36.0–46.0)
Hemoglobin: 13.2 g/dL (ref 12.0–15.0)
MCH: 28 pg (ref 26.0–34.0)
MCHC: 30.3 g/dL (ref 30.0–36.0)
MCV: 92.2 fL (ref 80.0–100.0)
Platelets: 195 10*3/uL (ref 150–400)
RBC: 4.72 MIL/uL (ref 3.87–5.11)
RDW: 12.6 % (ref 11.5–15.5)
WBC: 10 10*3/uL (ref 4.0–10.5)
nRBC: 0 % (ref 0.0–0.2)

## 2019-04-10 LAB — COMPREHENSIVE METABOLIC PANEL WITH GFR
ALT: 6 U/L (ref 0–44)
AST: 9 U/L — ABNORMAL LOW (ref 15–41)
Albumin: 4.7 g/dL (ref 3.5–5.0)
Alkaline Phosphatase: 47 U/L (ref 38–126)
Anion gap: 10 (ref 5–15)
BUN: 26 mg/dL — ABNORMAL HIGH (ref 8–23)
CO2: 26 mmol/L (ref 22–32)
Calcium: 9.1 mg/dL (ref 8.9–10.3)
Chloride: 105 mmol/L (ref 98–111)
Creatinine, Ser: 0.72 mg/dL (ref 0.44–1.00)
GFR calc Af Amer: >60 mL/min
GFR calc non Af Amer: >60 mL/min
Glucose, Bld: 130 mg/dL — ABNORMAL HIGH (ref 70–99)
Potassium: 4.2 mmol/L (ref 3.5–5.1)
Sodium: 141 mmol/L (ref 135–145)
Total Bilirubin: 0.9 mg/dL (ref 0.3–1.2)
Total Protein: 7.4 g/dL (ref 6.5–8.1)

## 2019-04-10 LAB — GLUCOSE, CAPILLARY: Glucose-Capillary: 100 mg/dL — ABNORMAL HIGH (ref 70–99)

## 2019-04-10 LAB — HEMOGLOBIN AND HEMATOCRIT, BLOOD
HCT: 43.1 % (ref 36.0–46.0)
Hemoglobin: 13 g/dL (ref 12.0–15.0)

## 2019-04-10 LAB — PROTIME-INR
INR: 1 (ref 0.8–1.2)
Prothrombin Time: 13 seconds (ref 11.4–15.2)

## 2019-04-10 LAB — TYPE AND SCREEN
ABO/RH(D): B POS
Antibody Screen: NEGATIVE

## 2019-04-10 LAB — SARS CORONAVIRUS 2 BY RT PCR (HOSPITAL ORDER, PERFORMED IN ~~LOC~~ HOSPITAL LAB): SARS Coronavirus 2: NEGATIVE

## 2019-04-10 MED ORDER — CARBIDOPA-LEVODOPA 25-100 MG PO TABS
1.0000 | ORAL_TABLET | Freq: Three times a day (TID) | ORAL | Status: DC
  Administered 2019-04-10 – 2019-04-12 (×4): 1 via ORAL
  Filled 2019-04-10 (×7): qty 1

## 2019-04-10 MED ORDER — IBANDRONATE SODIUM 150 MG PO TABS: 150.0000 mg | ORAL_TABLET | ORAL | Status: DC

## 2019-04-10 MED ORDER — INSULIN ASPART 100 UNIT/ML ~~LOC~~ SOLN: 0.0000 [IU] | Freq: Every day | SUBCUTANEOUS | Status: DC

## 2019-04-10 MED ORDER — ACETAMINOPHEN 325 MG PO TABS
650.0000 mg | ORAL_TABLET | Freq: Two times a day (BID) | ORAL | Status: DC
  Administered 2019-04-10 – 2019-04-12 (×4): 650 mg via ORAL
  Filled 2019-04-10 (×4): qty 2

## 2019-04-10 MED ORDER — INSULIN ASPART 100 UNIT/ML ~~LOC~~ SOLN: 0.0000 [IU] | Freq: Three times a day (TID) | SUBCUTANEOUS | Status: DC

## 2019-04-10 MED ORDER — ALBUTEROL SULFATE (2.5 MG/3ML) 0.083% IN NEBU: 2.5000 mg | INHALATION_SOLUTION | Freq: Four times a day (QID) | RESPIRATORY_TRACT | Status: DC | PRN

## 2019-04-10 MED ORDER — CARBIDOPA-LEVODOPA ER 50-200 MG PO TBCR
1.0000 | EXTENDED_RELEASE_TABLET | Freq: Three times a day (TID) | ORAL | Status: DC
  Administered 2019-04-10 – 2019-04-12 (×4): 1 via ORAL
  Filled 2019-04-10 (×7): qty 1

## 2019-04-10 MED ORDER — VITAMIN D 25 MCG (1000 UNIT) PO TABS
1000.0000 [IU] | ORAL_TABLET | Freq: Every day | ORAL | Status: DC
  Administered 2019-04-11 – 2019-04-12 (×2): 1000 [IU] via ORAL
  Filled 2019-04-10 (×2): qty 1

## 2019-04-10 MED ORDER — GUAIFENESIN ER 600 MG PO TB12: 600.0000 mg | ORAL_TABLET | Freq: Two times a day (BID) | ORAL | Status: DC | PRN

## 2019-04-10 MED ORDER — CARBOXYMETHYLCELLUL-GLYCERIN 0.5-0.9 % OP SOLN
1.0000 [drp] | Freq: Four times a day (QID) | OPHTHALMIC | Status: DC | PRN
  Filled 2019-04-10: qty 15

## 2019-04-10 MED ORDER — ATORVASTATIN CALCIUM 20 MG PO TABS
80.0000 mg | ORAL_TABLET | Freq: Every day | ORAL | Status: DC
  Administered 2019-04-11: 80 mg via ORAL
  Filled 2019-04-10: qty 4

## 2019-04-10 MED ORDER — CALCIUM CARBONATE ANTACID 500 MG PO CHEW: 1.0000 | CHEWABLE_TABLET | ORAL | Status: DC | PRN

## 2019-04-10 MED ORDER — HYDRALAZINE HCL 25 MG PO TABS
25.0000 mg | ORAL_TABLET | Freq: Four times a day (QID) | ORAL | Status: DC
  Administered 2019-04-10 – 2019-04-12 (×4): 25 mg via ORAL
  Filled 2019-04-10 (×4): qty 1

## 2019-04-10 MED ORDER — BUDESONIDE 0.5 MG/2ML IN SUSP
0.5000 mg | Freq: Two times a day (BID) | RESPIRATORY_TRACT | Status: DC
  Administered 2019-04-10 – 2019-04-12 (×4): 0.5 mg via RESPIRATORY_TRACT
  Filled 2019-04-10 (×4): qty 2

## 2019-04-10 MED ORDER — MIRABEGRON ER 25 MG PO TB24
25.0000 mg | ORAL_TABLET | Freq: Every day | ORAL | Status: DC
  Administered 2019-04-11 – 2019-04-12 (×2): 25 mg via ORAL
  Filled 2019-04-10 (×2): qty 1

## 2019-04-10 MED ORDER — ZINC OXIDE 11.3 % EX CREA
1.0000 "application " | TOPICAL_CREAM | CUTANEOUS | Status: DC | PRN
  Filled 2019-04-10: qty 56

## 2019-04-10 MED ORDER — PANTOPRAZOLE SODIUM 40 MG IV SOLR: 40.0000 mg | Freq: Two times a day (BID) | INTRAVENOUS | Status: DC

## 2019-04-10 MED ORDER — SODIUM CHLORIDE 0.9 % IV SOLN
80.0000 mg | Freq: Once | INTRAVENOUS | Status: AC
  Administered 2019-04-10: 80 mg via INTRAVENOUS
  Filled 2019-04-10: qty 80

## 2019-04-10 MED ORDER — SODIUM CHLORIDE 0.9 % IV SOLN
8.0000 mg/h | INTRAVENOUS | Status: DC
  Administered 2019-04-10 – 2019-04-12 (×4): 8 mg/h via INTRAVENOUS
  Filled 2019-04-10 (×4): qty 80

## 2019-04-10 MED ORDER — BOOST PLUS PO LIQD
237.0000 mL | Freq: Three times a day (TID) | ORAL | Status: DC
  Administered 2019-04-11 – 2019-04-12 (×3): 237 mL via ORAL
  Filled 2019-04-10: qty 237

## 2019-04-10 MED ORDER — HYDRALAZINE HCL 20 MG/ML IJ SOLN: 10.0000 mg | Freq: Four times a day (QID) | INTRAMUSCULAR | Status: DC | PRN

## 2019-04-10 MED ORDER — SENNOSIDES-DOCUSATE SODIUM 8.6-50 MG PO TABS
1.0000 | ORAL_TABLET | Freq: Every day | ORAL | Status: DC
  Administered 2019-04-11 – 2019-04-12 (×2): 1 via ORAL
  Filled 2019-04-10 (×2): qty 1

## 2019-04-10 MED ORDER — BENZONATATE 100 MG PO CAPS: 100.0000 mg | ORAL_CAPSULE | Freq: Three times a day (TID) | ORAL | Status: DC | PRN

## 2019-04-10 MED ORDER — LISINOPRIL 10 MG PO TABS
10.0000 mg | ORAL_TABLET | Freq: Every day | ORAL | Status: DC
  Administered 2019-04-10 – 2019-04-12 (×3): 10 mg via ORAL
  Filled 2019-04-10 (×3): qty 1

## 2019-04-10 MED ORDER — DICLOFENAC SODIUM 1 % TD GEL
2.0000 g | Freq: Three times a day (TID) | TRANSDERMAL | Status: DC | PRN
  Filled 2019-04-10: qty 100

## 2019-04-10 MED ORDER — PHENYLEPHRINE HCL 0.5 % NA SOLN
1.0000 [drp] | Freq: Once | NASAL | Status: AC
  Administered 2019-04-10: 1 [drp] via NASAL
  Filled 2019-04-10: qty 15

## 2019-04-10 NOTE — H&P (Signed)
[facility]   PATIENT NAME: Hannah MaxinKatie Son    MR#:  [id number]  DATE OF BIRTH:  [birth year]  DATE OF ADMISSION:  04/10/2019  PRIMARY CARE PHYSICIAN: System, Pcp Not In   REQUESTING/REFERRING PHYSICIAN: Marge DuncansMelinda, Paul  CHIEF COMPLAINT:   Chief Complaint  Patient presents with  . GI Problem  Dark stools.  Bleeding from nose.  HISTORY OF PRESENT ILLNESS:  Hannah Jackson  is a 85 y.o. female with a known history of dementia, diabetes mellitus, hypertension, Parkinson's disease and hyperlipidemia who presented to the emergency room with complaints of blood in stool with dark stools as well as epistaxis.  Patient reported to have been having black tarry stools for the last 2 days.  Bleeding from the nose started yesterday.  No abdominal pains.  No nausea vomiting.  No diarrhea.  Patient was evaluated in the emergency room.  Emergency room physician initially applied Afrin spray with compression but nosebleed persistent.  Patient subsequently had packing done on the left nose and bleeding appears to have stopped at the time of my evaluation.  Laboratory studies reveal stable hemoglobin at 13.2.  Medical service called to admit patient for further evaluation of GI bleed.  PAST MEDICAL HISTORY:   Past Medical History:  Diagnosis Date  . Asthma   . Diabetes mellitus without complication (HCC)    on metformin  . HLD (hyperlipidemia)   . HTN (hypertension)   . Parkinson's disease (HCC)     PAST SURGICAL HISTORY:   Past Surgical History:  Procedure Laterality Date  . BACK SURGERY      SOCIAL HISTORY:   Social History   Tobacco Use  . Smoking status: Never Smoker  . Smokeless tobacco: Never Used  Substance Use Topics  . Alcohol use: No    FAMILY HISTORY:   Family History  Problem Relation Age of Onset  . Leukemia Mother   . Stroke Mother   . CAD Father   . Hypertension Father   . Heart attack Father     DRUG ALLERGIES:   Allergies   Allergen Reactions  . Penicillins     Has patient had a PCN reaction causing immediate rash, facial/tongue/throat swelling, SOB or lightheadedness with hypotension: Unknown Has patient had a PCN reaction causing severe rash involving mucus membranes or skin necrosis: Unknown Has patient had a PCN reaction that required hospitalization: Unknown Has patient had a PCN reaction occurring within the last 10 years: Unknown If all of the above answers are "NO", then may proceed with Cephalosporin use.     REVIEW OF SYSTEMS:   Review of Systems  Constitutional: Negative for chills and fever.  HENT: Negative for hearing loss and tinnitus.   Eyes: Negative for blurred vision and double vision.  Respiratory: Negative for cough and shortness of breath.        Nosebleed  Cardiovascular: Negative for chest pain and palpitations.  Gastrointestinal: Positive for blood in stool and melena. Negative for heartburn and nausea.  Genitourinary: Negative for dysuria and urgency.  Musculoskeletal: Negative for myalgias and neck pain.  Skin: Negative for itching and rash.  Neurological: Negative for dizziness and headaches.  Psychiatric/Behavioral: Negative for depression and hallucinations.    MEDICATIONS AT HOME:   Prior to Admission medications   Medication Sig Start Date End Date Taking? Authorizing Provider  acetaminophen (TYLENOL) 650 MG CR tablet Take 650 mg by mouth 2 (two) times daily. For arthritis pain/stiffness   Yes [provider]  albuterol (PROVENTIL) (2.5 MG/3ML) 0.083% nebulizer solution Take 2.5 mg by nebulization every 6 (six) hours as needed for wheezing or shortness of breath.   Yes [provider]  aspirin EC 81 MG tablet Take 81 mg by mouth daily.   Yes [provider]  benzonatate (TESSALON) 100 MG capsule Take 100 mg by mouth 3 (three) times daily as needed for cough.   Yes [provider]  budesonide (PULMICORT) 0.5 MG/2ML nebulizer solution  Take 0.5 mg by nebulization 2 (two) times daily.   Yes [provider]  carbidopa-levodopa (SINEMET CR) 50-200 MG tablet Take 1 tablet by mouth 3 (three) times daily.    Yes [provider]  carbidopa-levodopa (SINEMET IR) 25-100 MG tablet Take 1 tablet by mouth 3 (three) times daily.   Yes [provider]  carboxymethylcellul-glycerin (OPTIVE) 0.5-0.9 % ophthalmic solution Place 1-2 drops into both eyes 4 (four) times daily as needed for dry eyes.   Yes [provider]  cholecalciferol (VITAMIN D) 1000 units tablet Take 1,000 Units by mouth daily.   Yes [provider]  dextrose (GLUTOSE) 40 % GEL Take 1 Tube by mouth once as needed for low blood sugar.   Yes [provider]  diclofenac sodium (VOLTAREN) 1 % GEL Apply 2 g topically 3 (three) times daily as needed (for wrist pain).   Yes [provider]  glucose 4 GM chewable tablet Chew 1 tablet (4 g total) by mouth 2 (two) times daily. 02/24/18  Yes Auburn Bilberry, MD  guaiFENesin (MUCINEX) 600 MG 12 hr tablet Take 600 mg by mouth 2 (two) times daily as needed for to loosen phlegm.    Yes [provider]  guaiFENesin-dextromethorphan (ROBITUSSIN DM) 100-10 MG/5ML syrup Take 5 mLs by mouth every 4 (four) hours as needed for cough. Patient taking differently: Take 5 mLs by mouth every 6 (six) hours as needed for cough.  02/24/18  Yes Auburn Bilberry, MD  hydrALAZINE (APRESOLINE) 25 MG tablet Take 25 mg by mouth 4 (four) times daily.   Yes [provider]  ibandronate (BONIVA) 150 MG tablet Take 150 mg by mouth every 30 (thirty) days. Take in the morning with a full glass of water, on an empty stomach, and do not take anything else by mouth or lie down for the next 60 min.   Yes [provider]  lactose free nutrition (BOOST PLUS) LIQD Take 237 mLs by mouth 3 (three) times daily with meals. 02/24/18  Yes Auburn Bilberry, MD  lisinopril (PRINIVIL,ZESTRIL) 10 MG tablet  Take 1 tablet (10 mg total) by mouth daily. 02/05/19  Yes Lule, Joana, PA  mirabegron ER (MYRBETRIQ) 25 MG TB24 tablet Take 25 mg by mouth daily.   Yes [provider]  senna-docusate (SENOKOT-S) 8.6-50 MG tablet Take 1 tablet by mouth daily.   Yes [provider]  Zinc Oxide 13 % CREA Apply 1 application topically as needed for irritation (apply cream to buttocks three times daily and as needed as a barrier cream).   Yes [provider]  atorvastatin (LIPITOR) 80 MG tablet Take 1 tablet (80 mg total) by mouth daily at 6 PM. Patient not taking: Reported on 04/10/2019 02/04/19   Stormy Fabian, PA  calcium carbonate (TUMS - DOSED IN MG ELEMENTAL CALCIUM) 500 MG chewable tablet Chew 1 tablet (200 mg of elemental calcium total) by mouth as needed for indigestion or heartburn. Patient not taking: Reported on 04/10/2019 02/04/19   Stormy Fabian, PA  VITAL SIGNS:  Blood pressure (!) 170/86, pulse 82, temperature 98.7 F (37.1 C), temperature source Oral, resp. rate 17, height  (1.651 m), weight 90.7 kg, SpO2 100 %.  PHYSICAL EXAMINATION:  Physical Exam  GENERAL:  85 y.o.-year-old patient lying in the bed with no acute distress.  EYES: Pupils equal, round, reactive to light and accommodation. No scleral icterus. Extraocular muscles intact.  HEENT: Packing in place to left nose due to presentation with epistaxis.  Head atraumatic, normocephalic. Oropharynx and nasopharynx clear.  NECK:  Supple, no jugular venous distention. No thyroid enlargement, no tenderness.  LUNGS: Normal breath sounds bilaterally, no wheezing, rales,rhonchi or crepitation. No use of accessory muscles of respiration.  CARDIOVASCULAR: S1, S2 normal. No murmurs, rubs, or gallops.  ABDOMEN: Soft, nontender, nondistended. Bowel sounds present. No organomegaly or mass.  EXTREMITIES: No pedal edema, cyanosis, or clubbing.  NEUROLOGIC: Generalized weakness with no focal deficit.  Gait not checked.   PSYCHIATRIC: The patient is alert and oriented x 3.  SKIN: No obvious rash, lesion, or ulcer.   LABORATORY PANEL:   CBC Recent Labs  Lab 04/10/19 1308  WBC 10.0  HGB 13.2  HCT 43.5  PLT 195   ------------------------------------------------------------------------------------------------------------------  Chemistries  Recent Labs  Lab 04/10/19 1308  NA 141  K 4.2  CL 105  CO2 26  GLUCOSE 130*  BUN 26*  CREATININE 0.72  CALCIUM 9.1  AST 9*  ALT 6  ALKPHOS 47  BILITOT 0.9   ------------------------------------------------------------------------------------------------------------------  Cardiac Enzymes No results for input(s): TROPONINI in the last 168 hours. ------------------------------------------------------------------------------------------------------------------  RADIOLOGY:  Dg Shoulder Right  Result Date: 04/10/2019 CLINICAL DATA:  Acute right shoulder pain without known injury. EXAM: RIGHT SHOULDER - 2+ VIEW COMPARISON:  None. FINDINGS: There is no evidence of fracture or dislocation. Mild degenerative changes seen involving the right acromioclavicular joint. Soft tissues are unremarkable. 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 No acute abnormality seen in the right shoulder. Electronically Signed   By: Lupita Raider M.D.   On: 04/10/2019 13:57      IMPRESSION AND PLAN:  Patient is an 85 year old female with history of dementia, diabetes mellitus, hypertension, Parkinson's disease and hyperlipidemia who presented to the emergency room with complaints of blood in stool with dark stools as well as epistaxis.   1.  GI bleed. Patient presented with melanotic stools.  Hemoglobin stable at 13.2. Already started on Protonix drip in the emergency room which was continued. Monitor hemoglobin every 8 hours.  Discontinued aspirin. Consulted gastroenterology service.  Message sent to Dr. Allegra Lai via epic  2.  Epistaxis  Appears to be controlled with nasal packing to the left nose in the emergency room.  3.  Hypertension Resumed home blood pressure medications with hydralazine. Placed on PRN IV hydralazine with parameters.  4.  Parkinson's disease Stable.  Resumed home dose of Sinemet.  5.  Diabetes mellitus type 2. Placed on sliding scale insulin coverage.  Glycosylated hemoglobin level in a.m.  All the records are reviewed and case discussed with ED provider. Management plans discussed with the patient, and she is in agreement. I called patient's sister listed in chart Ms. Napoleon Form, no response.  To update when available.   CODE STATUS: Full code  TOTAL TIME TAKING CARE OF THIS PATIENT: 58 minutes.    Hazel Leveille M.D on 04/10/2019 at 7:23 PM  Between 7am to 6pm - Pager - [phone]  After 6pm go to [email or web address] - password [email or web address]  [employer] [facility]  Office  [phone]  CC: Primary care physician; System, Pcp Not In   Note: This dictation was prepared with Dragon dictation along with smaller phrase technology. Any transcriptional errors that result from this process are unintentional.

## 2019-04-10 NOTE — ED Notes (Signed)
First call report, Debbie, RN to transfer to [employer]

## 2019-04-10 NOTE — TOC Initial Note (Signed)
Transition of Care [facility]) - Initial/Assessment Note    Patient Details  Name: Hannah Jackson MRN: [id number] Date of Birth: 05-18-1928  [facility] CM/SW Contact:    Allayne Butcher, RN Phone Number: 04/10/2019, 4:12 PM  Clinical Narrative:                 Patient is being seen in the ED for GI bleed and nose bleed, ED provider has decided on admission.  Patient is a member of [facility]- RNCM called [facility] and was able to speak with the provided to let them know that patient will be admitted.  Patient receives home health services from [facility] and she has a lot of family support as well.  [facility] is currently not seeing patient's at the [facility] center because of COVID.  [facility] number is [phone], they will handle services at discharge, please keep them updated.  Patient lives alone but again has a lot of family support.    Expected Discharge Plan: Home w Home Health Services([facility]) Barriers to Discharge: Continued Medical Work up   Patient Goals and CMS Choice Patient states their goals for this hospitalization and ongoing recovery are:: [facility] patient CMS Medicare.gov Compare Post Acute Care list provided to:: ([facility], provides all home servcies)    Expected Discharge Plan and Services Expected Discharge Plan: Home w Home Health Services([facility])     Post Acute Care Choice: Home Health Living arrangements for the past 2 months: Apartment                                      Prior Living Arrangements/Services Living arrangements for the past 2 months: Apartment Lives with:: Self Patient language and need for interpreter reviewed:: No Do you feel safe going back to the place where you live?: Yes      Need for Family Participation in Patient Care: Yes (Comment) Care giver support system in place?: Yes (comment)   Criminal Activity/Legal Involvement Pertinent to Current Situation/Hospitalization: No - Comment as needed  Activities of Daily Living      Permission  Sought/Granted Permission sought to share information with : Case Manager, Family Supports Permission granted to share information with : Yes, Verbal Permission Granted     Permission granted to share info w AGENCY: [facility]  Permission granted to share info w Relationship: Sisters and brother     Emotional Assessment Appearance:: Appears stated age   Affect (typically observed): Accepting Orientation: : Oriented to Self, Oriented to Place, Oriented to Situation Alcohol / Substance Use: Not Applicable Psych Involvement: No (comment)  Admission diagnosis:  gi bleed Patient Active Problem List   Diagnosis Date Noted  . AMS (altered mental status) 02/01/2019  . Hypoglycemia 02/26/2018  . UTI (urinary tract infection) 02/20/2018  . COPD (chronic obstructive pulmonary disease) (HCC)   . Asthma exacerbation 02/19/2018  . HTN (hypertension) 02/19/2018  . HLD (hyperlipidemia) 02/19/2018  . Diabetes (HCC) 02/19/2018  . Parkinson's disease (HCC) 02/19/2018   PCP:  System, Pcp Not In Pharmacy:   [facility] [place], [place] - [address] [place] [address] Phone: [phone] Fax: [phone]     Social Determinants of Health (SDOH) Interventions    Readmission Risk Interventions No flowsheet data found.

## 2019-04-10 NOTE — Progress Notes (Signed)
PHARMACIST - PHYSICIAN ORDER COMMUNICATION  CONCERNING: P&T Medication Policy   DESCRIPTION:  This patient's order for:  Ibandronate  has been noted.  This product(s) is not recommended to continue as inpatient.    ACTION TAKEN: The pharmacy department is unable to verify this order at this time and your patient has been informed of this safety policy. Please reevaluate patient's clinical condition at discharge and address if the herbal or natural product(s) should be resumed at that time.

## 2019-04-10 NOTE — ED Notes (Signed)
Pt had to be fully supported by 2 staff while sitting. Unable to obtain standing orthostatics.  PA gaines aware.

## 2019-04-10 NOTE — ED Notes (Signed)
Second call report 

## 2019-04-10 NOTE — ED Notes (Signed)
ED TO INPATIENT HANDOFF REPORT  ED Nurse Name and Phone #: Danelle Earthly [phone]  S Name/Age/Gender Hannah Jackson 85 y.o. female Room/Bed: ED15A/ED15A  Code Status   Code Status: Full Code  Home/SNF/Other Discharge pt to Home   Patient oriented to: self, place, time and situation   Is this baseline? Yes   Triage Complete: Triage complete  Chief Complaint gi bleed  Triage Note Pt in via EMS with c/o GI bleed that started yesterday with blood in her stool. Pt with black tarry stool this am and a nose bleed. 167/78, FSBS 145\, 98.9 T   Allergies Allergies  Allergen Reactions  . Penicillins     Has patient had a PCN reaction causing immediate rash, facial/tongue/throat swelling, SOB or lightheadedness with hypotension: Unknown Has patient had a PCN reaction causing severe rash involving mucus membranes or skin necrosis: Unknown Has patient had a PCN reaction that required hospitalization: Unknown Has patient had a PCN reaction occurring within the last 10 years: Unknown If all of the above answers are "NO", then may proceed with Cephalosporin use.     Level of Care/Admitting Diagnosis ED Disposition    ED Disposition Condition Comment   Admit  Hospital Area: [facility] [[id number]]  Level of Care: Med-Surg [16]  Covid Evaluation: Screening Protocol (No Symptoms)  Diagnosis: GI bleed [454098]  Admitting Physician: Jama Flavors [[id number]]  Attending Physician: Jama Flavors [[id number]]  Estimated length of stay: past midnight tomorrow  Certification:: I certify this patient will need inpatient services for at least 2 midnights  PT Class (Do Not Modify): Inpatient [101]  PT Acc Code (Do Not Modify): Private [1]       B Medical/Surgery History Past Medical History:  Diagnosis Date  . Asthma   . Diabetes mellitus without complication (HCC)    on metformin  . HLD (hyperlipidemia)   . HTN (hypertension)   . Parkinson's disease [facility])    Past Surgical History:   Procedure Laterality Date  . BACK SURGERY       A IV Location/Drains/Wounds Patient Lines/Drains/Airways Status   Active Line/Drains/Airways    Name:   Placement date:   Placement time:   Site:   Days:   Peripheral IV 04/10/19 Left Antecubital   04/10/19    1307    Antecubital   less than 1          Intake/Output Last 24 hours No intake or output data in the 24 hours ending 04/10/19 1938  Labs/Imaging Results for orders placed or performed during the hospital encounter of 04/10/19 (from the past 48 hour(s))  Comprehensive metabolic panel     Status: Abnormal   Collection Time: 04/10/19  1:08 PM  Result Value Ref Range   Sodium 141 135 - 145 mmol/L   Potassium 4.2 3.5 - 5.1 mmol/L   Chloride 105 98 - 111 mmol/L   CO2 26 22 - 32 mmol/L   Glucose, Bld 130 (H) 70 - 99 mg/dL   BUN 26 (H) 8 - 23 mg/dL   Creatinine, Ser 1.19 0.44 - 1.00 mg/dL   Calcium 9.1 8.9 - 14.7 mg/dL   Total Protein 7.4 6.5 - 8.1 g/dL   Albumin 4.7 3.5 - 5.0 g/dL   AST 9 (L) 15 - 41 U/L   ALT 6 0 - 44 U/L   Alkaline Phosphatase 47 38 - 126 U/L   Total Bilirubin 0.9 0.3 - 1.2 mg/dL   GFR calc non Af Amer >60 >60 mL/min  GFR calc Af Amer >60 >60 mL/min   Anion gap 10 5 - 15    Comment: Performed at [facility], [address] [place], [place] [address]  CBC     Status: None   Collection Time: 04/10/19  1:08 PM  Result Value Ref Range   WBC 10.0 4.0 - 10.5 K/uL   RBC 4.72 3.87 - 5.11 MIL/uL   Hemoglobin 13.2 12.0 - 15.0 g/dL   HCT 60.4 54.0 - 98.1 %   MCV 92.2 80.0 - 100.0 fL   MCH 28.0 26.0 - 34.0 pg   MCHC 30.3 30.0 - 36.0 g/dL   RDW 19.1 47.8 - 29.5 %   Platelets 195 150 - 400 K/uL   nRBC 0.0 0.0 - 0.2 %    Comment: Performed at [facility], [address] [place], [place] [address]  Protime-INR     Status: None   Collection Time: 04/10/19  1:09 PM  Result Value Ref Range   Prothrombin Time 13.0 11.4 - 15.2 seconds   INR 1.0 0.8 - 1.2    Comment: (NOTE) INR goal  varies based on device and disease states. Performed at [facility], [address], [place], [place] [address]   SARS Coronavirus 2 (CEPHEID - Performed in [facility] hospital lab), Hosp Order     Status: None   Collection Time: 04/10/19  1:44 PM  Result Value Ref Range   SARS Coronavirus 2 NEGATIVE NEGATIVE    Comment: (NOTE) If result is NEGATIVE SARS-CoV-2 target nucleic acids are NOT DETECTED. The SARS-CoV-2 RNA is generally detectable in upper and lower  respiratory specimens during the acute phase of infection. The lowest  concentration of SARS-CoV-2 viral copies this assay can detect is 250  copies / mL. A negative result does not preclude SARS-CoV-2 infection  and should not be used as the sole basis for treatment or other  patient management decisions.  A negative result may occur with  improper specimen collection / handling, submission of specimen other  than nasopharyngeal swab, presence of viral mutation(s) within the  areas targeted by this assay, and inadequate number of viral copies  (<250 copies / mL). A negative result must be combined with clinical  observations, patient history, and epidemiological information. If result is POSITIVE SARS-CoV-2 target nucleic acids are DETECTED. The SARS-CoV-2 RNA is generally detectable in upper and lower  respiratory specimens dur ing the acute phase of infection.  Positive  results are indicative of active infection with SARS-CoV-2.  Clinical  correlation with patient history and other diagnostic information is  necessary to determine patient infection status.  Positive results do  not rule out bacterial infection or co-infection with other viruses. If result is PRESUMPTIVE POSTIVE SARS-CoV-2 nucleic acids MAY BE PRESENT.   A presumptive positive result was obtained on the submitted specimen  and confirmed on repeat testing.  While 2019 novel coronavirus  (SARS-CoV-2) nucleic acids may be present in the submitted  sample  additional confirmatory testing may be necessary for epidemiological  and / or clinical management purposes  to differentiate between  SARS-CoV-2 and other Sarbecovirus currently known to infect humans.  If clinically indicated additional testing with an alternate test  methodology [phone]) is advised. The SARS-CoV-2 RNA is generally  detectable in upper and lower respiratory sp ecimens during the acute  phase of infection. The expected result is Negative. Fact Sheet for Patients:  BoilerBrush.com.cy Fact Sheet for Healthcare Providers: https://pope.com/ This test is not yet  approved or cleared by the [place] FDA and has been authorized for detection and/or diagnosis of SARS-CoV-2 by FDA under an Emergency Use Authorization (EUA).  This EUA will remain in effect (meaning this test can be used) for the duration of the COVID-19 declaration under Section 564(b)(1) of the Act, 21 U.S.C. section 360bbb-3(b)(1), unless the authorization is terminated or revoked sooner. Performed at [facility], [address] [place], [place] [address]   Type and screen     Status: None   Collection Time: 04/10/19  5:05 PM  Result Value Ref Range   ABO/RH(D) B POS    Antibody Screen NEG    Sample Expiration      04/13/2019,2359 Performed at [facility], [address] [place], [place] [address]    Dg Shoulder Right  Result Date: 04/10/2019 CLINICAL DATA:  Acute right shoulder pain without known injury. EXAM: RIGHT SHOULDER - 2+ VIEW COMPARISON:  None. FINDINGS: There is no evidence of fracture or dislocation. Mild degenerative changes seen involving the right acromioclavicular joint. Soft tissues are unremarkable. 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 No acute abnormality seen in the right shoulder. Electronically Signed   By: Lupita RaiderJames  Green Jr M.D.   On: 04/10/2019 13:57     Pending Labs Unresulted Labs (From admission, onward)    Start     Ordered   04/11/19 0500  Hemoglobin A1c  Tomorrow morning,   STAT    Comments:  To assess prior glycemic control    04/10/19 1922   04/11/19 0500  Basic metabolic panel  Tomorrow morning,   STAT     04/10/19 1922   04/11/19 0500  CBC  Tomorrow morning,   STAT     04/10/19 1922   04/11/19 0500  Magnesium  Tomorrow morning,   STAT     04/10/19 1922   04/11/19 0200  Hemoglobin and hematocrit, blood  Once,   STAT     04/10/19 1922   04/10/19 2000  Hemoglobin and hematocrit, blood  Once,   STAT     04/10/19 1922          Vitals/Pain Today's Vitals   04/10/19 1630 04/10/19 1730 04/10/19 1800 04/10/19 1900  BP: (!) 171/69 (!) 176/75 (!) 170/86 (!) 164/69  Pulse: 80 80 82 74  Resp: 20 18 17 20   Temp:      TempSrc:      SpO2: 100% 100% 100% 99%  Weight:      Height:      PainSc:        Isolation Precautions No active isolations  Medications Medications  pantoprazole (PROTONIX) 80 mg in sodium chloride 0.9 % 250 mL (0.32 mg/mL) infusion (8 mg/hr Intravenous New Bag/Given 04/10/19 1633)  pantoprazole (PROTONIX) injection 40 mg (has no administration in time range)  acetaminophen (TYLENOL) tablet 650 mg (has no administration in time range)  atorvastatin (LIPITOR) tablet 80 mg (has no administration in time range)  hydrALAZINE (APRESOLINE) tablet 25 mg (has no administration in time range)  lisinopril (ZESTRIL) tablet 10 mg (has no administration in time range)  calcium carbonate (TUMS - dosed in mg elemental calcium) chewable tablet 200 mg of elemental calcium (has no administration in time range)  senna-docusate (Senokot-S) tablet 1 tablet (has no administration in time range)  mirabegron ER (MYRBETRIQ) tablet 25 mg (has no administration in time range)  carbidopa-levodopa (SINEMET CR) 50-200 MG per tablet controlled release 1 tablet (has no administration in time range)  carbidopa-levodopa (SINEMET IR)  25-100 MG per tablet immediate release 1 tablet (has no administration in time range)  cholecalciferol (VITAMIN D3) tablet 1,000 Units (has no administration in time range)  lactose free nutrition (BOOST PLUS) liquid 237 mL (has no administration in time range)  albuterol (PROVENTIL) (2.5 MG/3ML) 0.083% nebulizer solution 2.5 mg (has no administration in time range)  benzonatate (TESSALON) capsule 100 mg (has no administration in time range)  budesonide (PULMICORT) nebulizer solution 0.5 mg (has no administration in time range)  guaiFENesin (MUCINEX) 12 hr tablet 600 mg (has no administration in time range)  carboxymethylcellul-glycerin (REFRESH OPTIVE) 0.5-0.9 % ophthalmic solution 1-2 drop (has no administration in time range)  diclofenac sodium (VOLTAREN) 1 % transdermal gel 2 g (has no administration in time range)  zinc oxide (BALMEX) 11.3 % cream 1 application (has no administration in time range)  insulin aspart (novoLOG) injection 0-9 Units (has no administration in time range)  insulin aspart (novoLOG) injection 0-5 Units (has no administration in time range)  hydrALAZINE (APRESOLINE) injection 10 mg (has no administration in time range)  phenylephrine (NEO-SYNEPHRINE) 0.5 % nasal solution 1 drop (1 drop Each Nare Given 04/10/19 1324)  pantoprazole (PROTONIX) 80 mg in sodium chloride 0.9 % 100 mL IVPB (0 mg Intravenous Stopped 04/10/19 1631)    Mobility non-ambulatory Moderate fall risk   Focused Assessments Cardiac Assessment Handoff:    Lab Results  Component Value Date   CKTOTAL 341 (H) 02/01/2019   TROPONINI <0.03 02/06/2016   No results found for: DDIMER Does the Patient currently have chest pain? No     R Recommendations: See Admitting Provider Note  Report given to:   Additional Notes: Marilynn POC for info ditribution

## 2019-04-10 NOTE — ED Notes (Signed)
PA aware nose still bleeding

## 2019-04-10 NOTE — ED Triage Notes (Signed)
Pt in via EMS with c/o GI bleed that started yesterday with blood in her stool. Pt with black tarry stool this am and a nose bleed. 167/78, FSBS 145\, 98.9 T

## 2019-04-10 NOTE — ED Notes (Signed)
Pt transported att

## 2019-04-10 NOTE — ED Provider Notes (Signed)
[facility] EMERGENCY DEPARTMENT Provider Note   CSN: [id number] Arrival date & time: 04/10/19  1244    History   Chief Complaint Chief Complaint  Patient presents with  . GI Problem    HPI Hannah Jackson is a 85 y.o. female.  Presents to the emergency department for evaluation of blood in stool and epistasis.  Patient has past medical history consistent with diabetes mellitus, hyperlipidemia, hypertension, Parkinson's and dementia.  Patient states that for 2 days she is noticed black tarry stools that have been somewhat loose.  She denies any bright red blood.  She is also had some bleeding from the left nare has been on and off since yesterday.  Patient denies any abdominal pain, chest pain, shortness of breath, dizziness, lightheadedness.  No blood thinner except for aspirin.  She denies any falls trauma or injury.  Patient does complain of some right shoulder pain that has been present for years.  No numbness tingling radicular symptoms.    HPI  Past Medical History:  Diagnosis Date  . Asthma   . Diabetes mellitus without complication (HCC)    on metformin  . HLD (hyperlipidemia)   . HTN (hypertension)   . Parkinson's disease [facility])     Patient Active Problem List   Diagnosis Date Noted  . AMS (altered mental status) 02/01/2019  . Hypoglycemia 02/26/2018  . UTI (urinary tract infection) 02/20/2018  . COPD (chronic obstructive pulmonary disease) (HCC)   . Asthma exacerbation 02/19/2018  . HTN (hypertension) 02/19/2018  . HLD (hyperlipidemia) 02/19/2018  . Diabetes (HCC) 02/19/2018  . Parkinson's disease (HCC) 02/19/2018    Past Surgical History:  Procedure Laterality Date  . BACK SURGERY       OB History   No obstetric history on file.      Home Medications    Prior to Admission medications   Medication Sig Start Date End Date Taking? Authorizing Provider  acetaminophen (TYLENOL) 650 MG CR tablet Take 650 mg by mouth 2 (two) times  daily. For arthritis pain/stiffness    [provider]  albuterol (PROVENTIL) (2.5 MG/3ML) 0.083% nebulizer solution Take 2.5 mg by nebulization every 6 (six) hours as needed for wheezing or shortness of breath.    [provider]  aspirin EC 81 MG tablet Take 81 mg by mouth daily.    [provider]  atorvastatin (LIPITOR) 80 MG tablet Take 1 tablet (80 mg total) by mouth daily at 6 PM. 02/04/19   Lule, Joana, PA  benzonatate (TESSALON) 100 MG capsule Take 100 mg by mouth 3 (three) times daily as needed for cough.    [provider]  budesonide (PULMICORT) 0.5 MG/2ML nebulizer solution Take 0.5 mg by nebulization 2 (two) times daily.    [provider]  calcium carbonate (TUMS - DOSED IN MG ELEMENTAL CALCIUM) 500 MG chewable tablet Chew 1 tablet (200 mg of elemental calcium total) by mouth as needed for indigestion or heartburn. 02/04/19   Lule, Arvil Persons, PA  carbidopa-levodopa (SINEMET CR) 50-200 MG tablet Take 1 tablet by mouth 3 (three) times daily.     [provider]  carbidopa-levodopa (SINEMET IR) 25-100 MG tablet Take 1 tablet by mouth 3 (three) times daily.    [provider]  carboxymethylcellul-glycerin (OPTIVE) 0.5-0.9 % ophthalmic solution Place 1-2 drops into both eyes 4 (four) times daily as needed for dry eyes.    [provider]  cholecalciferol (VITAMIN D) 1000 units tablet Take 1,000 Units by mouth  daily.    [provider]  diclofenac sodium (VOLTAREN) 1 % GEL Apply 2 g topically 3 (three) times daily as needed (for wrist pain).    [provider]  glucose 4 GM chewable tablet Chew 1 tablet (4 g total) by mouth 2 (two) times daily. 02/24/18   Auburn BilberryPatel, Shreyang, MD  guaiFENesin (MUCINEX) 600 MG 12 hr tablet Take 600 mg by mouth 2 (two) times daily as needed for to loosen phlegm.     [provider]  guaiFENesin-dextromethorphan (ROBITUSSIN DM) 100-10 MG/5ML syrup Take 5 mLs by mouth every 4  (four) hours as needed for cough. 02/24/18   Auburn BilberryPatel, Shreyang, MD  hydrALAZINE (APRESOLINE) 25 MG tablet Take 1 tablet (25 mg total) by mouth every 6 (six) hours for 30 days. 02/04/19 03/06/19  Stormy FabianLule, Joana, PA  ibandronate (BONIVA) 150 MG tablet Take 150 mg by mouth every 30 (thirty) days. Take in the morning with a full glass of water, on an empty stomach, and do not take anything else by mouth or lie down for the next 60 min.    [provider]  lactose free nutrition (BOOST PLUS) LIQD Take 237 mLs by mouth 3 (three) times daily with meals. 02/24/18   Auburn BilberryPatel, Shreyang, MD  lisinopril (PRINIVIL,ZESTRIL) 10 MG tablet Take 1 tablet (10 mg total) by mouth daily. 02/05/19   Lule, Arvil PersonsJoana, PA  mirabegron ER (MYRBETRIQ) 25 MG TB24 tablet Take 25 mg by mouth daily.    [provider]  pregabalin (LYRICA) 75 MG capsule Take 75 mg by mouth at bedtime.     [provider]  ranitidine (ZANTAC) 150 MG tablet Take 150 mg by mouth 2 (two) times daily.    [provider]  senna-docusate (SENOKOT-S) 8.6-50 MG tablet Take 1 tablet by mouth daily.    [provider]  Zinc Oxide 13 % CREA Apply 1 application topically as needed for irritation (apply cream to buttocks three times daily and as needed as a barrier cream).    [provider]    Family History Family History  Problem Relation Age of Onset  . Leukemia Mother   . Stroke Mother   . CAD Father   . Hypertension Father   . Heart attack Father     Social History Social History   Tobacco Use  . Smoking status: Never Smoker  . Smokeless tobacco: Never Used  Substance Use Topics  . Alcohol use: No  . Drug use: No     Allergies   Penicillins   Review of Systems Review of Systems  Constitutional: Negative for activity change, chills, fatigue and fever.  HENT: Positive for nosebleeds. Negative for congestion, sinus pressure and sore throat.   Eyes: Negative for visual disturbance.  Respiratory:  Negative for cough, chest tightness and shortness of breath.   Cardiovascular: Negative for chest pain and leg swelling.  Gastrointestinal: Positive for blood in stool. Negative for abdominal pain, diarrhea, nausea and vomiting.  Genitourinary: Negative for dysuria.  Musculoskeletal: Positive for arthralgias. Negative for gait problem.  Skin: Negative for rash.  Neurological: Negative for weakness, numbness and headaches.  Hematological: Negative for adenopathy.  Psychiatric/Behavioral: Negative for agitation, behavioral problems and confusion.     Physical Exam Updated Vital Signs BP (!) 172/80   Pulse 85   Temp 98.7 F (37.1 C) (Oral)   Resp 19   Ht 5\' 5"  (1.651 m)   Wt 90.7 kg   SpO2 98%   BMI 33.28 kg/m  Physical Exam Constitutional:      Appearance: She is well-developed.  HENT:     Head: Normocephalic and atraumatic.     Comments: Mild bright red blood on the left nare.  No septal hematoma.  Clots present. Eyes:     Conjunctiva/sclera: Conjunctivae normal.  Neck:     Musculoskeletal: Normal range of motion.  Cardiovascular:     Rate and Rhythm: Normal rate.  Pulmonary:     Effort: Pulmonary effort is normal. No respiratory distress.  Abdominal:     General: Abdomen is flat. Bowel sounds are normal. There is no distension.     Tenderness: There is no abdominal tenderness. There is no right CVA tenderness, left CVA tenderness or guarding.  Genitourinary:    Rectum: Guaiac result positive.     Comments: Positive stool guaiac test.  Black tarry stool noted Musculoskeletal: Normal range of motion.  Skin:    General: Skin is warm.     Findings: No rash.  Neurological:     Mental Status: She is alert and oriented to person, place, and time.  Psychiatric:        Behavior: Behavior normal.        Thought Content: Thought content normal.      ED Treatments / Results  Labs (all labs ordered are listed, but only abnormal results are displayed) Labs Reviewed   COMPREHENSIVE METABOLIC PANEL - Abnormal; Notable for the following components:      Result Value   Glucose, Bld 130 (*)    BUN 26 (*)    AST 9 (*)    All other components within normal limits  SARS CORONAVIRUS 2 (HOSPITAL ORDER, PERFORMED IN [facility])  CBC  PROTIME-INR  POC OCCULT BLOOD, ED  TYPE AND SCREEN    EKG None  Radiology Dg Shoulder Right  Result Date: 04/10/2019 CLINICAL DATA:  Acute right shoulder pain without known injury. EXAM: RIGHT SHOULDER - 2+ VIEW COMPARISON:  None. FINDINGS: There is no evidence of fracture or dislocation. Mild degenerative changes seen involving the right acromioclavicular joint. Soft tissues are unremarkable. 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 No acute abnormality seen in the right shoulder. Electronically Signed   By: Lupita Raider M.D.   On: 04/10/2019 13:57    Procedures Procedures (including critical care time)  Medications Ordered in ED Medications  pantoprazole (PROTONIX) 80 mg in sodium chloride 0.9 % 100 mL IVPB (80 mg Intravenous New Bag/Given 04/10/19 1604)  pantoprazole (PROTONIX) 80 mg in sodium chloride 0.9 % 250 mL (0.32 mg/mL) infusion (has no administration in time range)  pantoprazole (PROTONIX) injection 40 mg (has no administration in time range)  phenylephrine (NEO-SYNEPHRINE) 0.5 % nasal solution 1 drop (1 drop Each Nare Given 04/10/19 1324)     Initial Impression / Assessment and Plan / ED Course  I have reviewed the triage vital signs and the nursing notes.  Pertinent labs & imaging results that were available during my care of the patient were reviewed by me and considered in my medical decision making (see chart for details).        85 year old female with left nare epistasis and GI bleed.  She is not taking blood thinners.  She is hemodynamically stable.  She is started on Protonix.  Epistasis controlled with [employer] packing.  Unable to achieve  resolution of epistasis with clamping and Neo-Synephrine spray.  Discussed case with hospitalist who will admit patient. Final Clinical Impressions(s) / ED Diagnoses  Final diagnoses:  Upper GI bleed  Epistaxis    ED Discharge Orders    None       Ronnette Juniper 04/10/19 1652    Don Perking, [place], MD 04/11/19 [phone]

## 2019-04-10 NOTE — ED Notes (Signed)
Updated sister Doristine Counter with patients permission.

## 2019-04-10 NOTE — ED Notes (Signed)
Updated marylyn, pt family/POA.

## 2019-04-10 NOTE — Progress Notes (Signed)
Care Alignment Note  Advanced Directives Documents (Living Will, Power of Attorney) currently in the EHR no advanced directives documents available .  Has the patient discussed their wishes with their family/healthcare power of attorney no.  What does the patient/decision maker understand about their medical condition and the natural course of their disease.  GI bleed.  Epistaxis.  Diabetes mellitus.  Hypertension and Parkinson's disease  What is the patient/decision maker's biggest fear or concern for the future pain and suffering   What is the most important goal for this patient should their health condition worsen maintenance of function.  Current   Code Status: Full Code  Current code status has been reviewed/updated.  Time spent:21 minutes

## 2019-04-10 NOTE — ED Notes (Signed)
Hannah Jackson, updated, call Doristine Counter with questions

## 2019-04-10 NOTE — ED Notes (Signed)
Admitting MD at bedside.

## 2019-04-10 NOTE — Progress Notes (Signed)
[place], sister- [phone] Fabio Bering, sister [phone] Kevin Fenton, sister [phone]

## 2019-04-10 NOTE — ED Provider Notes (Signed)
Afrin spray and compression did not stop the patient's nosebleed.  We had the patient blow the clots out and looked in the patient's nose did not see bleeding site.  Merisel was inserted.  Small amount of Afrin was added to this and then saline.  Patient seems to be doing well at this point with her nosebleed.   Arnaldo Natal, MD 04/10/19 [phone]

## 2019-04-10 NOTE — ED Notes (Signed)
Updated Hannah Jackson pts POA.  Pt kept removing nose clamp. Have reapplied and pt has left on for now.

## 2019-04-10 NOTE — ED Notes (Signed)
Para March RN reached att, will review chart with questions

## 2019-04-11 DIAGNOSIS — K921 Melena: Principal | ICD-10-CM

## 2019-04-11 LAB — BASIC METABOLIC PANEL
Anion gap: 7 (ref 5–15)
BUN: 19 mg/dL (ref 8–23)
CO2: 24 mmol/L (ref 22–32)
Calcium: 8.3 mg/dL — ABNORMAL LOW (ref 8.9–10.3)
Chloride: 109 mmol/L (ref 98–111)
Creatinine, Ser: 0.6 mg/dL (ref 0.44–1.00)
GFR calc Af Amer: >60 mL/min (ref >60)
GFR calc non Af Amer: >60 mL/min (ref >60)
Glucose, Bld: 102 mg/dL — ABNORMAL HIGH (ref 70–99)
Potassium: 4 mmol/L (ref 3.5–5.1)
Sodium: 140 mmol/L (ref 135–145)

## 2019-04-11 LAB — VITAMIN B12: Vitamin B-12: 255 pg/mL (ref 180–914)

## 2019-04-11 LAB — GLUCOSE, CAPILLARY
Glucose-Capillary: 92 mg/dL (ref 70–99)
Glucose-Capillary: 76 mg/dL (ref 70–99)
Glucose-Capillary: 105 mg/dL — ABNORMAL HIGH (ref 70–99)
Glucose-Capillary: 92 mg/dL (ref 70–99)

## 2019-04-11 LAB — CBC
HCT: 35.3 % — ABNORMAL LOW (ref 36.0–46.0)
Hemoglobin: 11.3 g/dL — ABNORMAL LOW (ref 12.0–15.0)
MCH: 28.5 pg (ref 26.0–34.0)
MCHC: 32 g/dL (ref 30.0–36.0)
MCV: 88.9 fL (ref 80.0–100.0)
Platelets: 135 10*3/uL — ABNORMAL LOW (ref 150–400)
RBC: 3.97 MIL/uL (ref 3.87–5.11)
RDW: 12.6 % (ref 11.5–15.5)
WBC: 8.3 10*3/uL (ref 4.0–10.5)
nRBC: 0.5 % — ABNORMAL HIGH (ref 0.0–0.2)

## 2019-04-11 LAB — MAGNESIUM: Magnesium: 1.9 mg/dL (ref 1.7–2.4)

## 2019-04-11 LAB — FERRITIN: Ferritin: 32 ng/mL (ref 11–307)

## 2019-04-11 LAB — IRON AND TIBC
Iron: 53 ug/dL (ref 28–170)
Saturation Ratios: 19 % (ref 10.4–31.8)
TIBC: 284 ug/dL (ref 250–450)
UIBC: 231 ug/dL

## 2019-04-11 LAB — HEMOGLOBIN A1C
Hgb A1c MFr Bld: 5.9 % — ABNORMAL HIGH (ref 4.8–5.6)
Mean Plasma Glucose: 122.63 mg/dL

## 2019-04-11 LAB — FOLATE: Folate: 11.3 ng/mL (ref >5.9)

## 2019-04-11 NOTE — Consult Note (Signed)
Hannah Darby, MD [address]  [place]  [place], [place] [address]  Main: [phone]  Fax: [phone] Pager: [phone]   Consultation  Referring Provider:     No ref. provider found Primary Care Physician:  System, Pcp Not In Primary Gastroenterologist: None         Reason for Consultation:     Melena  Date of Admission:  04/10/2019 Date of Consultation:  04/11/2019         HPI:   Hannah Jackson is a 85 y.o. female with history of dementia, diabetes mellitus, hypertension, Parkinson's disease and hyperlipidemia who presented to the emergency room with complaints of blood in stool with dark stools as well as epistaxis.  Patient reported to have been having black tarry stools for the last 2 days.  Bleeding from the nose started yesterday. No abdominal pains.  No nausea vomiting.  No diarrhea.  Epistaxis was controlled in the ER.  Patient was started on pantoprazole drip.  Kept n.p.o. GI is consulted for further evaluation.  Patient has been hemodynamically stable throughout admission.  Her BUN was elevated, normal creatinine at 26/0.72.  Hemoglobin on admission was 13, dropped to 11.3 today.  She also received IV fluids.  She does have normal baseline hemoglobin.  Patient is not on PPI as outpatient  NSAIDs: None  Antiplts/Anticoagulants/Anti thrombotics: None  GI Procedures: Unknown  Past Medical History:  Diagnosis Date  . Asthma   . Diabetes mellitus without complication ([place])    on metformin  . HLD (hyperlipidemia)   . HTN (hypertension)   . Parkinson's disease [facility])     Past Surgical History:  Procedure Laterality Date  . BACK SURGERY      Prior to Admission medications   Medication Sig Start Date End Date Taking? Authorizing Provider  acetaminophen (TYLENOL) 650 MG CR tablet Take 650 mg by mouth 2 (two) times daily. For arthritis pain/stiffness   Yes [provider]  albuterol (PROVENTIL) (2.5 MG/3ML) 0.083% nebulizer solution Take 2.5 mg  by nebulization every 6 (six) hours as needed for wheezing or shortness of breath.   Yes [provider]  aspirin EC 81 MG tablet Take 81 mg by mouth daily.   Yes [provider]  benzonatate (TESSALON) 100 MG capsule Take 100 mg by mouth 3 (three) times daily as needed for cough.   Yes [provider]  budesonide (PULMICORT) 0.5 MG/2ML nebulizer solution Take 0.5 mg by nebulization 2 (two) times daily.   Yes [provider]  carbidopa-levodopa (SINEMET CR) 50-200 MG tablet Take 1 tablet by mouth 3 (three) times daily.    Yes [provider]  carbidopa-levodopa (SINEMET IR) 25-100 MG tablet Take 1 tablet by mouth 3 (three) times daily.   Yes [provider]  carboxymethylcellul-glycerin (OPTIVE) 0.5-0.9 % ophthalmic solution Place 1-2 drops into both eyes 4 (four) times daily as needed for dry eyes.   Yes [provider]  cholecalciferol (VITAMIN D) 1000 units tablet Take 1,000 Units by mouth daily.   Yes [provider]  dextrose (GLUTOSE) 40 % GEL Take 1 Tube by mouth once as needed for low blood sugar.   Yes [provider]  diclofenac sodium (VOLTAREN) 1 % GEL Apply 2 g topically 3 (three) times daily as needed (for wrist pain).   Yes [provider]  glucose 4 GM chewable tablet Chew 1 tablet (4 g total) by mouth 2 (two) times daily. 02/24/18  Yes Posey Pronto,  Shreyang, MD  guaiFENesin (MUCINEX) 600 MG 12 hr tablet Take 600 mg by mouth 2 (two) times daily as needed for to loosen phlegm.    Yes [provider]  guaiFENesin-dextromethorphan (ROBITUSSIN DM) 100-10 MG/5ML syrup Take 5 mLs by mouth every 4 (four) hours as needed for cough. Patient taking differently: Take 5 mLs by mouth every 6 (six) hours as needed for cough.  02/24/18  Yes Dustin Flock, MD  hydrALAZINE (APRESOLINE) 25 MG tablet Take 25 mg by mouth 4 (four) times daily.   Yes [provider]  ibandronate (BONIVA) 150 MG tablet Take  150 mg by mouth every 30 (thirty) days. Take in the morning with a full glass of water, on an empty stomach, and do not take anything else by mouth or lie down for the next 60 min.   Yes [provider]  lactose free nutrition (BOOST PLUS) LIQD Take 237 mLs by mouth 3 (three) times daily with meals. 02/24/18  Yes Dustin Flock, MD  lisinopril (PRINIVIL,ZESTRIL) 10 MG tablet Take 1 tablet (10 mg total) by mouth daily. 02/05/19  Yes Lule, Joana, PA  mirabegron ER (MYRBETRIQ) 25 MG TB24 tablet Take 25 mg by mouth daily.   Yes [provider]  senna-docusate (SENOKOT-S) 8.6-50 MG tablet Take 1 tablet by mouth daily.   Yes [provider]  Zinc Oxide 13 % CREA Apply 1 application topically as needed for irritation (apply cream to buttocks three times daily and as needed as a barrier cream).   Yes [provider]  atorvastatin (LIPITOR) 80 MG tablet Take 1 tablet (80 mg total) by mouth daily at 6 PM. Patient not taking: Reported on 04/10/2019 02/04/19   Ripley Fraise, PA  calcium carbonate (TUMS - DOSED IN MG ELEMENTAL CALCIUM) 500 MG chewable tablet Chew 1 tablet (200 mg of elemental calcium total) by mouth as needed for indigestion or heartburn. Patient not taking: Reported on 04/10/2019 02/04/19   Ripley Fraise, PA    Current Facility-Administered Medications:  .  acetaminophen (TYLENOL) tablet 650 mg, 650 mg, Oral, BID, Ojie, Jude, MD, 650 mg at 04/11/19 1257 .  albuterol (PROVENTIL) (2.5 MG/3ML) 0.083% nebulizer solution 2.5 mg, 2.5 mg, Nebulization, Q6H PRN, Ojie, Jude, MD .  atorvastatin (LIPITOR) tablet 80 mg, 80 mg, Oral, q1800, Ojie, Jude, MD .  benzonatate (TESSALON) capsule 100 mg, 100 mg, Oral, TID PRN, Ojie, Jude, MD .  budesonide (PULMICORT) nebulizer solution 0.5 mg, 0.5 mg, Nebulization, BID, Ojie, Jude, MD, 0.5 mg at 04/11/19 0728 .  calcium carbonate (TUMS - dosed in mg elemental calcium) chewable tablet 200 mg of elemental calcium, 1 tablet, Oral, PRN, Ojie,  Jude, MD .  carbidopa-levodopa (SINEMET CR) 50-200 MG per tablet controlled release 1 tablet, 1 tablet, Oral, TID, Ojie, Jude, MD, 1 tablet at 04/10/19 2200 .  carbidopa-levodopa (SINEMET IR) 25-100 MG per tablet immediate release 1 tablet, 1 tablet, Oral, TID, Ojie, Jude, MD, 1 tablet at 04/10/19 2200 .  carboxymethylcellul-glycerin (REFRESH OPTIVE) 0.5-0.9 % ophthalmic solution 1-2 drop, 1-2 drop, Both Eyes, QID PRN, Ojie, Jude, MD .  cholecalciferol (VITAMIN D3) tablet 1,000 Units, 1,000 Units, Oral, Daily, Ojie, Jude, MD, 1,000 Units at 04/11/19 1259 .  diclofenac sodium (VOLTAREN) 1 % transdermal gel 2 g, 2 g, Topical, TID PRN, Ojie, Jude, MD .  guaiFENesin (MUCINEX) 12 hr tablet 600 mg, 600 mg, Oral, BID PRN, Ojie, Jude, MD .  hydrALAZINE (APRESOLINE) injection 10 mg, 10 mg, Intravenous, Q6H PRN, Stark Jock, Jude, MD .  hydrALAZINE (APRESOLINE) tablet 25 mg, 25 mg, Oral, QID, Ojie, Jude, MD, 25 mg at 04/11/19 1258 .  insulin aspart (novoLOG) injection 0-5 Units, 0-5 Units, Subcutaneous, QHS, Ojie, Jude, MD .  insulin aspart (novoLOG) injection 0-9 Units, 0-9 Units, Subcutaneous, TID WC, Ojie, Jude, MD .  lactose free nutrition (BOOST PLUS) liquid 237 mL, 237 mL, Oral, TID WC, Ojie, Jude, MD, 237 mL at 04/11/19 1259 .  lisinopril (ZESTRIL) tablet 10 mg, 10 mg, Oral, Daily, Ojie, Jude, MD, 10 mg at 04/11/19 1258 .  mirabegron ER (MYRBETRIQ) tablet 25 mg, 25 mg, Oral, Daily, Ojie, Jude, MD, 25 mg at 04/11/19 1258 .  pantoprazole (PROTONIX) 80 mg in sodium chloride 0.9 % 250 mL (0.32 mg/mL) infusion, 8 mg/hr, Intravenous, Continuous, Ojie, Jude, MD, Last Rate: 25 mL/hr at 04/11/19 1442, 8 mg/hr at 04/11/19 1442 .  [START ON 04/14/2019] pantoprazole (PROTONIX) injection 40 mg, 40 mg, Intravenous, Q12H, Alfred Levins, [place], MD .  senna-docusate (Senokot-S) tablet 1 tablet, 1 tablet, Oral, Daily, Ojie, Jude, MD, 1 tablet at 04/11/19 1259 .  zinc oxide (BALMEX) 31.5 % cream 1 application, 1 application,  Topical, PRN, Stark Jock, Jude, MD  Family History  Problem Relation Age of Onset  . Leukemia Mother   . Stroke Mother   . CAD Father   . Hypertension Father   . Heart attack Father      Social History   Tobacco Use  . Smoking status: Never Smoker  . Smokeless tobacco: Never Used  Substance Use Topics  . Alcohol use: No  . Drug use: No    Allergies as of 04/10/2019 - Review Complete 04/10/2019  Allergen Reaction Noted  . Penicillins  02/20/2018    Review of Systems:    All systems reviewed and negative except where noted in HPI.   Physical Exam:  Vital signs in last 24 hours: Temp:  [97.5 F (36.4 C)-99.4 F (37.4 C)] 97.5 F (36.4 C) (05/23 1247) Pulse Rate:  [58-82] 64 (05/23 1247) Resp:  [17-20] 18 (05/23 1247) BP: (123-176)/(69-86) 159/83 (05/23 1247) SpO2:  [99 %-100 %] 99 % (05/23 1247)   General:   Pleasant, cooperative in NAD Head:  Normocephalic and atraumatic. Eyes:   No icterus.   Conjunctiva pink. PERRLA. Ears:  Normal auditory acuity. Neck:  Supple; no masses or thyroidomegaly Lungs: Respirations even and unlabored. Lungs clear to auscultation bilaterally.   No wheezes, crackles, or rhonchi.  Heart:  Regular rate and rhythm;  Without murmur, clicks, rubs or gallops Abdomen:  Soft, nondistended, nontender. Normal bowel sounds. No appreciable masses or hepatomegaly.  No rebound or guarding.  Rectal:  Not performed. Msk:  Symmetrical without gross deformities.  Extremities:  Without edema, cyanosis or clubbing. Neurologic:  Alert and oriented x1 Skin:  Intact without significant lesions or rashes.  LAB RESULTS: CBC Latest Ref Rng & Units 04/11/2019 04/10/2019 04/10/2019  WBC 4.0 - 10.5 K/uL 8.3 - 10.0  Hemoglobin 12.0 - 15.0 g/dL 11.3(L) 13.0 13.2  Hematocrit 36.0 - 46.0 % 35.3(L) 43.1 43.5  Platelets 150 - 400 K/uL 135(L) - 195    BMET BMP Latest Ref Rng & Units 04/11/2019 04/10/2019 02/04/2019  Glucose 70 - 99 mg/dL 102(H) 130(H) 119(H)  BUN 8 - 23  mg/dL 19 26(H) 25(H)  Creatinine 0.44 - 1.00 mg/dL 0.60 0.72 0.46  Sodium 135 - 145 mmol/L 140 141 141  Potassium 3.5 - 5.1 mmol/L 4.0 4.2 3.6  Chloride 98 - 111 mmol/L 109 105 108  CO2 22 -  32 mmol/L _0 Calcium 8.9 - 10.3 mg/dL 8.3(L) 9.1 8.3(L)    LFT Hepatic Function Latest Ref Rng & Units 04/10/2019 02/01/2019 02/26/2018  Total Protein 6.5 - 8.1 g/dL 7.4 7.4 6.4(L)  Albumin 3.5 - 5.0 g/dL 4.7 4.9 3.6  AST 15 - 41 U/L 9(L) 18 18  ALT 0 - 44 U/L 6 9 5(L)  Alk Phosphatase 38 - 126 U/L 47 54 54  Total Bilirubin 0.3 - 1.2 mg/dL 0.9 1.1 0.7     STUDIES: Dg Shoulder Right  Result Date: 04/10/2019 CLINICAL DATA:  Acute right shoulder pain without known injury. EXAM: RIGHT SHOULDER - 2+ VIEW COMPARISON:  None. FINDINGS: There is no evidence of fracture or dislocation. Mild degenerative changes seen involving the right acromioclavicular joint. Soft tissues are unremarkable. 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 No acute abnormality seen in the right shoulder. Electronically Signed   By: Marijo Conception M.D.   On: 04/10/2019 13:57      Impression / Plan:   BRODY KUMP is a 85 y.o. female with advanced dementia, Parkinson's, diabetes, hypertension admitted with epistaxis and melena. Patient is not actively bleeding at this time and is hemodynamically stable. Currently, on pantoprazole drip. Pt lives at home with family's support. Part of PACE program, has HHA.  Discussed patient's clinical course with the physician on call from pace program, Dr Wilford Grist who mentioned that patient has advanced dementia, although she has decent quality of life but dependent on ADLs.  Also, I tried to call patient's sister Ruthann Cancer and left a voicemail to call me back to discuss my recommendations.  At this point, I will defer EGD unless patient becomes hemodynamically unstable or her hemoglobin continues to drop and will decide only after talking to her sister  Faythe Ghee  with clear liquid diet Continue pantoprazole drip Monitor CBC daily  Thank you for involving me in the care of this patient.  Will follow along with you    LOS: 1 day   Sherri Sear, MD  04/11/2019, 4:34 PM   Note: This dictation was prepared with Dragon dictation along with smaller phrase technology. Any transcriptional errors that result from this process are unintentional.

## 2019-04-11 NOTE — Progress Notes (Signed)
[facility] - [place] at [facility]   PATIENT NAME: Hannah Jackson    MR#:  [id number]  DATE OF BIRTH:  07-11-1928  SUBJECTIVE:  CHIEF COMPLAINT:   Chief Complaint  Patient presents with   GI Problem   The patient said she had melena. REVIEW OF SYSTEMS:  Review of Systems  Constitutional: Positive for malaise/fatigue. Negative for chills and fever.  HENT: Negative for nosebleeds and sore throat.   Eyes: Negative for blurred vision and double vision.  Respiratory: Negative for cough, hemoptysis, shortness of breath, wheezing and stridor.   Cardiovascular: Negative for chest pain, palpitations, orthopnea and leg swelling.  Gastrointestinal: Positive for melena. Negative for abdominal pain, blood in stool, diarrhea, nausea and vomiting.  Genitourinary: Negative for dysuria, flank pain and hematuria.  Musculoskeletal: Negative for back pain and joint pain.  Skin: Negative for rash.  Neurological: Negative for dizziness, sensory change, focal weakness, seizures, loss of consciousness, weakness and headaches.  Endo/Heme/Allergies: Negative for polydipsia.  Psychiatric/Behavioral: Negative for depression. The patient is not nervous/anxious.     DRUG ALLERGIES:   Allergies  Allergen Reactions   Penicillins     Has patient had a PCN reaction causing immediate rash, facial/tongue/throat swelling, SOB or lightheadedness with hypotension: Unknown Has patient had a PCN reaction causing severe rash involving mucus membranes or skin necrosis: Unknown Has patient had a PCN reaction that required hospitalization: Unknown Has patient had a PCN reaction occurring within the last 10 years: Unknown If all of the above answers are "NO", then may proceed with Cephalosporin use.    VITALS:  Blood pressure (!) 159/83, pulse 64, temperature (!) 97.5 F (36.4 C), temperature source Oral, resp. rate 18, height 5\' 5"  (1.651 m), weight 90.7 kg, SpO2 99 %. PHYSICAL EXAMINATION:    Physical Exam Constitutional:      General: She is not in acute distress.    Appearance: Normal appearance. She is obese.  HENT:     Head: Normocephalic.     Mouth/Throat:     Mouth: Mucous membranes are moist.  Eyes:     General: No scleral icterus.    Conjunctiva/sclera: Conjunctivae normal.     Pupils: Pupils are equal, round, and reactive to light.  Neck:     Musculoskeletal: Normal range of motion and neck supple.     Vascular: No JVD.     Trachea: No tracheal deviation.  Cardiovascular:     Rate and Rhythm: Normal rate and regular rhythm.     Heart sounds: Normal heart sounds. No murmur. No gallop.   Pulmonary:     Effort: Pulmonary effort is normal. No respiratory distress.     Breath sounds: Normal breath sounds. No wheezing or rales.  Abdominal:     General: Bowel sounds are normal. There is no distension.     Palpations: Abdomen is soft.     Tenderness: There is no abdominal tenderness. There is no rebound.  Musculoskeletal: Normal range of motion.        General: No tenderness.     Right lower leg: No edema.     Left lower leg: No edema.  Skin:    Findings: No erythema or rash.  Neurological:     General: No focal deficit present.     Mental Status: She is alert and oriented to person, place, and time.     Cranial Nerves: No cranial nerve deficit.  Psychiatric:        Mood and Affect: Mood normal.  LABORATORY PANEL:  Female CBC Recent Labs  Lab 04/11/19 0426  WBC 8.3  HGB 11.3*  HCT 35.3*  PLT 135*   ------------------------------------------------------------------------------------------------------------------ Chemistries  Recent Labs  Lab 04/10/19 1308 04/11/19 0426  NA 141 140  K 4.2 4.0  CL 105 109  CO2 26 24  GLUCOSE 130* 102*  BUN 26* 19  CREATININE 0.72 0.60  CALCIUM 9.1 8.3*  MG  --  1.9  AST 9*  --   ALT 6  --   ALKPHOS 47  --   BILITOT 0.9  --    RADIOLOGY:  Dg Shoulder Right  Result Date: 04/10/2019 CLINICAL DATA:   Acute right shoulder pain without known injury. EXAM: RIGHT SHOULDER - 2+ VIEW COMPARISON:  None. FINDINGS: There is no evidence of fracture or dislocation. Mild degenerative changes seen involving the right acromioclavicular joint. Soft tissues are unremarkable. 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 No acute abnormality seen in the right shoulder. Electronically Signed   By: Lupita RaiderJames  Green Jr M.D.   On: 04/10/2019 13:57   ASSESSMENT AND PLAN:   Patient is an 85 year old female with history of dementia, diabetes mellitus, hypertension, Parkinson's disease and hyperlipidemia who presented to the emergency room with complaints of blood in stool with dark stools as well as epistaxis.   1.  GI bleed. Patient presented with melanotic stools.  Hemoglobin decreased from 13.2-11.3. Continue Protonix drip, hold aspirin. Clear liquid diet today and n.p.o. after midnight for EGD tomorrow per Dr. Allegra LaiVanga.  2.  Epistaxis Appears to be controlled with nasal packing to the left nose.  3.  Hypertension Resumed home blood pressure medications with hydralazine. Placed on PRN IV hydralazine with parameters.  4.  Parkinson's disease Stable.  Resumed home dose of Sinemet.  5.  Diabetes mellitus type 2. Continue sliding scale insulin coverage.  Glycosylated hemoglobin 5.9. I discussed with Dr. Allegra LaiVanga. All the records are reviewed and case discussed with Care Management/Social Worker. Management plans discussed with the patient, her husband and they are in agreement.  CODE STATUS: Full Code  TOTAL TIME TAKING CARE OF THIS PATIENT: 32 minutes.   More than 50% of the time was spent in counseling/coordination of care: YES  POSSIBLE D/C IN 2 DAYS, DEPENDING ON CLINICAL CONDITION.   Shaune PollackQing Wilbert Hayashi M.D on 04/11/2019 at 1:03 PM  Between 7am to 6pm - Pager - [phone]  After 6pm go to [email or web address] - Therapist, nutritionalpassword EPAS [email or web address]  [facility]

## 2019-04-11 NOTE — Plan of Care (Signed)
Pt alert to self. Pt had 1 large black stool and a smear during the shift. Last BP 113/45. Hydralazine 1800 dose held. VSS. No bleeding noted. No c/o n/v. Pt c/o pain to R shoulder  when repositioned.  Slept between care. Pt needs total assist with ADL and need assistance with meals.

## 2019-04-12 LAB — GLUCOSE, CAPILLARY
Glucose-Capillary: 112 mg/dL — ABNORMAL HIGH (ref 70–99)
Glucose-Capillary: 128 mg/dL — ABNORMAL HIGH (ref 70–99)

## 2019-04-12 LAB — HEMOGLOBIN: Hemoglobin: 10.8 g/dL — ABNORMAL LOW (ref 12.0–15.0)

## 2019-04-12 MED ORDER — PANTOPRAZOLE SODIUM 40 MG PO TBEC
40.0000 mg | DELAYED_RELEASE_TABLET | Freq: Two times a day (BID) | ORAL | Status: DC
  Administered 2019-04-12: 40 mg via ORAL
  Filled 2019-04-12: qty 1

## 2019-04-12 MED ORDER — PANTOPRAZOLE SODIUM 40 MG PO TBEC: 40.0000 mg | DELAYED_RELEASE_TABLET | Freq: Two times a day (BID) | ORAL | 0 refills | Status: DC

## 2019-04-12 NOTE — Discharge Instructions (Signed)
PACE program °

## 2019-04-12 NOTE — Discharge Summary (Signed)
[facility] [place] [facility]   PATIENT NAME: Hannah MaxinKatie Jackson    MR#:  [id number]  DATE OF BIRTH:  01-04-1928  DATE OF ADMISSION:  04/10/2019   ADMITTING PHYSICIAN: Jama FlavorsJude Ojie, MD  DATE OF DISCHARGE: 04/12/2019  PRIMARY CARE PHYSICIAN: System, Pcp Not In   ADMISSION DIAGNOSIS:  Epistaxis [R04.0] Upper GI bleed [K92.2] DISCHARGE DIAGNOSIS:  Active Problems:   GI bleed  SECONDARY DIAGNOSIS:   Past Medical History:  Diagnosis Date  . Asthma   . Diabetes mellitus without complication (HCC)    on metformin  . HLD (hyperlipidemia)   . HTN (hypertension)   . Parkinson's disease [facility](HCC)    HOSPITAL COURSE:  Patient is an 85 year old female with history of dementia,diabetes mellitus,hypertension,Parkinson's disease and hyperlipidemia who presented to the emergency room with complaints of blood in stool with dark stools as well as epistaxis.   1.GI bleed. Patient presented with melanotic stools. Hemoglobin decreased from 13.2-11.3. She was treated with Protonix drip, hold aspirin. No operative procedure per family's request. No active bleeding, hemoglobin is 10.8.  Per Dr. Allegra LaiVanga, patient can be discharged home and follow-up as outpatient.  2.Epistaxis Controlled with nasal packing to the left nose.  3.Hypertension Resumed home blood pressure medications with hydralazine. Placed on PRN IV hydralazine with parameters.  4.Parkinson's disease Stable. Resumed home dose of Sinemet.  5.Diabetes mellitus type 2. Continue sliding scale insulin coverage. Glycosylated hemoglobin 5.9. I discussed with Dr. Allegra LaiVanga. I discussed with [facility] on call nurse practitioner. DISCHARGE CONDITIONS:  Stable, discharge to home and resume PACE program CONSULTS OBTAINED:  Treatment Team:  Toney ReilVanga, Rohini Reddy, MD DRUG ALLERGIES:   Allergies  Allergen Reactions  . Penicillins     Has patient had a PCN reaction causing immediate rash, facial/tongue/throat  swelling, SOB or lightheadedness with hypotension: Unknown Has patient had a PCN reaction causing severe rash involving mucus membranes or skin necrosis: Unknown Has patient had a PCN reaction that required hospitalization: Unknown Has patient had a PCN reaction occurring within the last 10 years: Unknown If all of the above answers are "NO", then may proceed with Cephalosporin use.    DISCHARGE MEDICATIONS:   Allergies as of 04/12/2019      Reactions   Penicillins    Has patient had a PCN reaction causing immediate rash, facial/tongue/throat swelling, SOB or lightheadedness with hypotension: Unknown Has patient had a PCN reaction causing severe rash involving mucus membranes or skin necrosis: Unknown Has patient had a PCN reaction that required hospitalization: Unknown Has patient had a PCN reaction occurring within the last 10 years: Unknown If all of the above answers are "NO", then may proceed with Cephalosporin use.      Medication List    STOP taking these medications   aspirin EC 81 MG tablet     TAKE these medications   acetaminophen 650 MG CR tablet Commonly known as:  TYLENOL Take 650 mg by mouth 2 (two) times daily. For arthritis pain/stiffness   albuterol (2.5 MG/3ML) 0.083% nebulizer solution Commonly known as:  PROVENTIL Take 2.5 mg by nebulization every 6 (six) hours as needed for wheezing or shortness of breath.   atorvastatin 80 MG tablet Commonly known as:  LIPITOR Take 1 tablet (80 mg total) by mouth daily at 6 PM.   benzonatate 100 MG capsule Commonly known as:  TESSALON Take 100 mg by mouth 3 (three) times daily as needed for cough.   budesonide 0.5 MG/2ML nebulizer solution Commonly known as:  PULMICORT Take 0.5 mg by nebulization 2 (two) times daily.   calcium carbonate 500 MG chewable tablet Commonly known as:  TUMS - dosed in mg elemental calcium Chew 1 tablet (200 mg of elemental calcium total) by mouth as needed for indigestion or heartburn.    carbidopa-levodopa 25-100 MG tablet Commonly known as:  SINEMET IR Take 1 tablet by mouth 3 (three) times daily.   Sinemet CR 50-200 MG tablet Generic drug:  carbidopa-levodopa Take 1 tablet by mouth 3 (three) times daily.   cholecalciferol 1000 units tablet Commonly known as:  VITAMIN D Take 1,000 Units by mouth daily.   diclofenac sodium 1 % Gel Commonly known as:  VOLTAREN Apply 2 g topically 3 (three) times daily as needed (for wrist pain).   dextrose 40 % Gel Commonly known as:  GLUTOSE Take 1 Tube by mouth once as needed for low blood sugar.   glucose 4 GM chewable tablet Chew 1 tablet (4 g total) by mouth 2 (two) times daily.   guaiFENesin 600 MG 12 hr tablet Commonly known as:  MUCINEX Take 600 mg by mouth 2 (two) times daily as needed for to loosen phlegm.   guaiFENesin-dextromethorphan 100-10 MG/5ML syrup Commonly known as:  ROBITUSSIN DM Take 5 mLs by mouth every 4 (four) hours as needed for cough. What changed:  when to take this   hydrALAZINE 25 MG tablet Commonly known as:  APRESOLINE Take 25 mg by mouth 4 (four) times daily.   ibandronate 150 MG tablet Commonly known as:  BONIVA Take 150 mg by mouth every 30 (thirty) days. Take in the morning with a full glass of water, on an empty stomach, and do not take anything else by mouth or lie down for the next 60 min.   lactose free nutrition Liqd Take 237 mLs by mouth 3 (three) times daily with meals.   lisinopril 10 MG tablet Commonly known as:  ZESTRIL Take 1 tablet (10 mg total) by mouth daily.   Myrbetriq 25 MG Tb24 tablet Generic drug:  mirabegron ER Take 25 mg by mouth daily.   OPTIVE 0.5-0.9 % ophthalmic solution Generic drug:  carboxymethylcellul-glycerin Place 1-2 drops into both eyes 4 (four) times daily as needed for dry eyes.   pantoprazole 40 MG tablet Commonly known as:  PROTONIX Take 1 tablet (40 mg total) by mouth 2 (two) times daily before a meal.   senna-docusate 8.6-50 MG tablet  Commonly known as:  Senokot-S Take 1 tablet by mouth daily.   Zinc Oxide 13 % Crea Apply 1 application topically as needed for irritation (apply cream to buttocks three times daily and as needed as a barrier cream).        DISCHARGE INSTRUCTIONS:  See AVS  If you experience worsening of your admission symptoms, develop shortness of breath, life threatening emergency, suicidal or homicidal thoughts you must seek medical attention immediately by calling 911 or calling your MD immediately  if symptoms less severe.  You Must read complete instructions/literature along with all the possible adverse reactions/side effects for all the Medicines you take and that have been prescribed to you. Take any new Medicines after you have completely understood and accpet all the possible adverse reactions/side effects.   Please note  You were cared for by a hospitalist during your hospital stay. If you have any questions about your discharge medications or the care you received while you were in the hospital after you are discharged, you can call the unit and asked to  speak with the hospitalist on call if the hospitalist that took care of you is not available. Once you are discharged, your primary care physician will handle any further medical issues. Please note that NO REFILLS for any discharge medications will be authorized once you are discharged, as it is imperative that you return to your primary care physician (or establish a relationship with a primary care physician if you do not have one) for your aftercare needs so that they can reassess your need for medications and monitor your lab values.    On the day of Discharge:  VITAL SIGNS:  Blood pressure 125/73, pulse 68, temperature 98.7 F (37.1 C), temperature source Oral, resp. rate 20, height  (1.651 m), weight 90.7 kg, SpO2 99 %. PHYSICAL EXAMINATION:  GENERAL:  85 y.o.-year-old patient lying in the bed with no acute distress.  Obesity.  EYES: Pupils equal, round, reactive to light and accommodation. No scleral icterus. Extraocular muscles intact.  HEENT: Head atraumatic, normocephalic. Oropharynx and nasopharynx clear.  NECK:  Supple, no jugular venous distention. No thyroid enlargement, no tenderness.  LUNGS: Normal breath sounds bilaterally, no wheezing, rales,rhonchi or crepitation. No use of accessory muscles of respiration.  CARDIOVASCULAR: S1, S2 normal. No murmurs, rubs, or gallops.  ABDOMEN: Soft, non-tender, non-distended. Bowel sounds present. No organomegaly or mass.  EXTREMITIES: No pedal edema, cyanosis, or clubbing.  NEUROLOGIC: Cranial nerves II through XII are intact. Muscle strength 3-4/5 in all extremities. Sensation intact. Gait not checked.  PSYCHIATRIC: The patient is demented. SKIN: No obvious rash, lesion, or ulcer.  DATA REVIEW:   CBC Recent Labs  Lab 04/11/19 0426 04/12/19 0725  WBC 8.3  --   HGB 11.3* 10.8*  HCT 35.3*  --   PLT 135*  --     Chemistries  Recent Labs  Lab 04/10/19 1308 04/11/19 0426  NA 141 140  K 4.2 4.0  CL 105 109  CO2 26 24  GLUCOSE 130* 102*  BUN 26* 19  CREATININE 0.72 0.60  CALCIUM 9.1 8.3*  MG  --  1.9  AST 9*  --   ALT 6  --   ALKPHOS 47  --   BILITOT 0.9  --      Microbiology Results  Results for orders placed or performed during the hospital encounter of 04/10/19  SARS Coronavirus 2 (CEPHEID - Performed in [facility] hospital lab), Hosp Order     Status: None   Collection Time: 04/10/19  1:44 PM  Result Value Ref Range Status   SARS Coronavirus 2 NEGATIVE NEGATIVE Final    Comment: (NOTE) If result is NEGATIVE SARS-CoV-2 target nucleic acids are NOT DETECTED. The SARS-CoV-2 RNA is generally detectable in upper and lower  respiratory specimens during the acute phase of infection. The lowest  concentration of SARS-CoV-2 viral copies this assay can detect is 250  copies / mL. A negative result does not preclude SARS-CoV-2 infection  and  should not be used as the sole basis for treatment or other  patient management decisions.  A negative result may occur with  improper specimen collection / handling, submission of specimen other  than nasopharyngeal swab, presence of viral mutation(s) within the  areas targeted by this assay, and inadequate number of viral copies  (<250 copies / mL). A negative result must be combined with clinical  observations, patient history, and epidemiological information. If result is POSITIVE SARS-CoV-2 target nucleic acids are DETECTED. The SARS-CoV-2 RNA is generally detectable in upper and lower  respiratory specimens dur ing the acute phase of infection.  Positive  results are indicative of active infection with SARS-CoV-2.  Clinical  correlation with patient history and other diagnostic information is  necessary to determine patient infection status.  Positive results do  not rule out bacterial infection or co-infection with other viruses. If result is PRESUMPTIVE POSTIVE SARS-CoV-2 nucleic acids MAY BE PRESENT.   A presumptive positive result was obtained on the submitted specimen  and confirmed on repeat testing.  While 2019 novel coronavirus  (SARS-CoV-2) nucleic acids may be present in the submitted sample  additional confirmatory testing may be necessary for epidemiological  and / or clinical management purposes  to differentiate between  SARS-CoV-2 and other Sarbecovirus currently known to infect humans.  If clinically indicated additional testing with an alternate test  methodology [phone]) is advised. The SARS-CoV-2 RNA is generally  detectable in upper and lower respiratory sp ecimens during the acute  phase of infection. The expected result is Negative. Fact Sheet for Patients:  BoilerBrush.com.cy Fact Sheet for Healthcare Providers: https://pope.com/ This test is not yet approved or cleared by the [place] FDA and has been  authorized for detection and/or diagnosis of SARS-CoV-2 by FDA under an Emergency Use Authorization (EUA).  This EUA will remain in effect (meaning this test can be used) for the duration of the COVID-19 declaration under Section 564(b)(1) of the Act, 21 U.S.C. section 360bbb-3(b)(1), unless the authorization is terminated or revoked sooner. Performed at [facility] [address], [place], [place] [address]     RADIOLOGY:  No results found.   Management plans discussed with the patient, family and they are in agreement.  CODE STATUS: Full Code   TOTAL TIME TAKING CARE OF THIS PATIENT: 32 minutes.    Shaune Pollack M.D on 04/12/2019 at 11:09 AM  Between 7am to 6pm - Pager - [phone]  After 6pm go to [email or web address] - Social research officer, government  [facility]  Office  [phone]  CC: Primary care physician; System, Pcp Not In   Note: This dictation was prepared with Dragon dictation along with smaller phrase technology. Any transcriptional errors that result from this process are unintentional.

## 2019-04-12 NOTE — Progress Notes (Signed)
Patient sister Kevin Fenton POA called along with patient other family members, POA  Asking if patient doctor  would reeval patients medications . Family was requesting a phone call from GI in which Dr. Allegra Lai stated that she will call family back. Family also states that they do not want patient to have any invasive procedures at this time.

## 2020-07-02 IMAGING — CT CT HEAD WITHOUT CONTRAST
3 series · 16 of 47 positions shown, 19 images · non-contrast
Comparison: 09/16/2015

CLINICAL DATA: Dementia. Parkinson's disease. Found on the floor
today.

EXAM:
CT HEAD WITHOUT CONTRAST
TECHNIQUE: Contiguous axial images were obtained from the base of the skull
through the vertex without intravenous contrast.

[Series 2: head wo · axial · 0.42mm/px · z∈[-108,+17]mm · 10 of 30 slices shown, 13 images]
[im 3/30  brain]
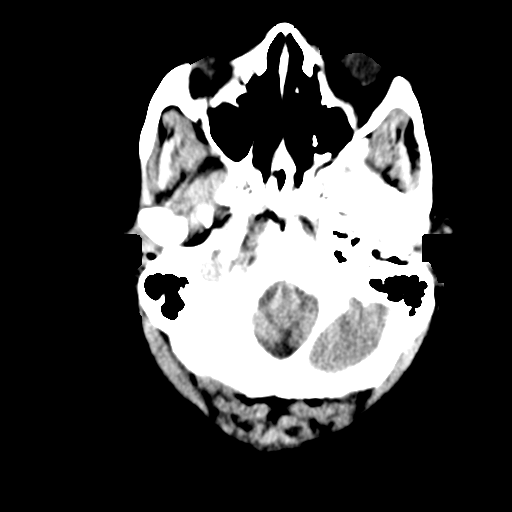
[im 3/30  bone]
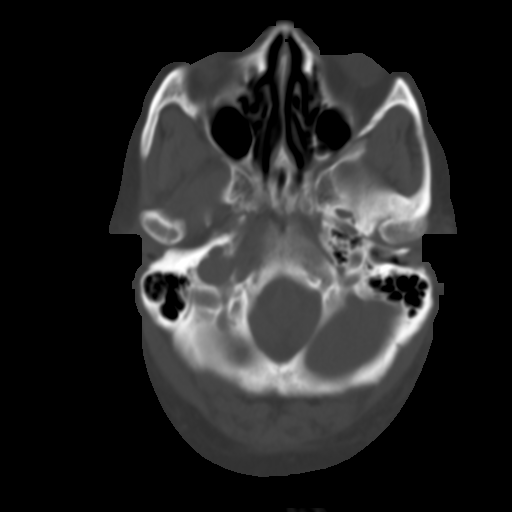
[im 6/30  brain]
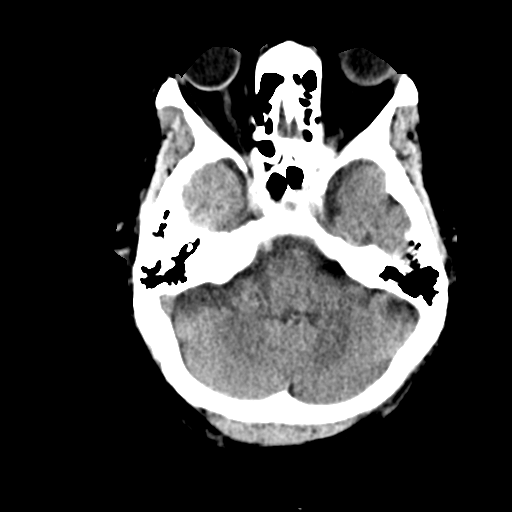
[im 9/30  brain]
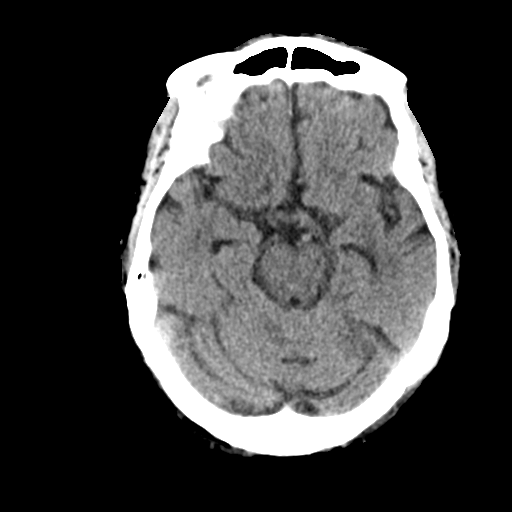
[im 11/30  brain]
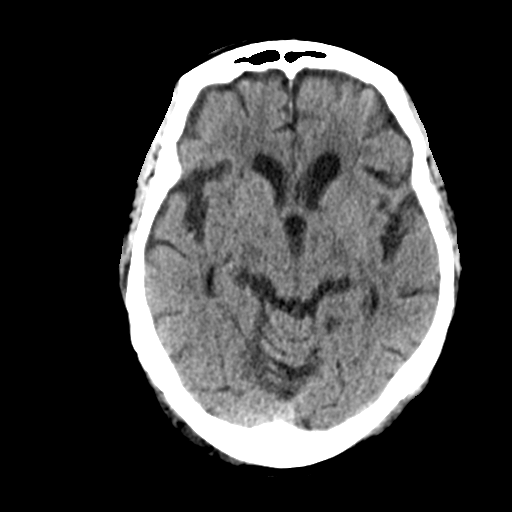
[im 14/30  brain]
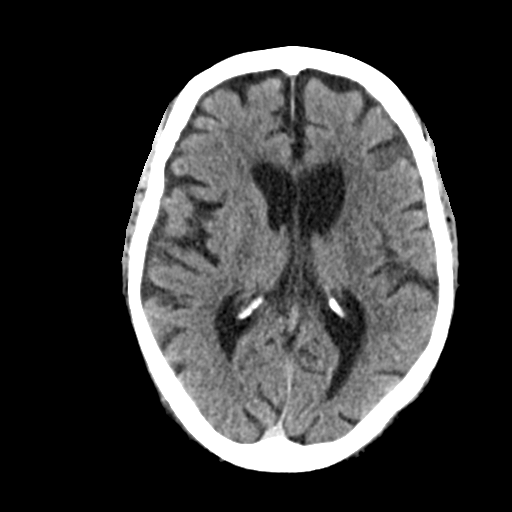
[im 14/30  bone]
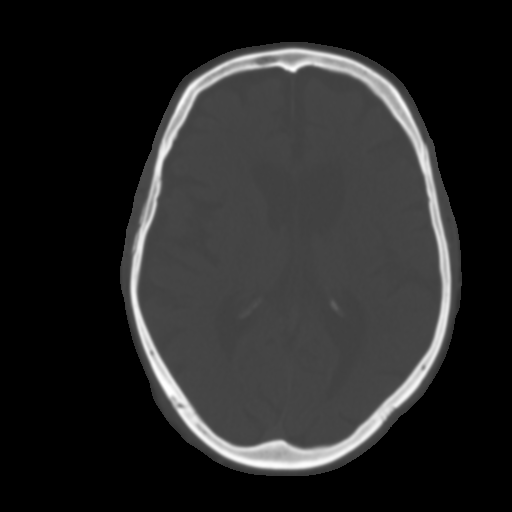
[im 17/30  brain]
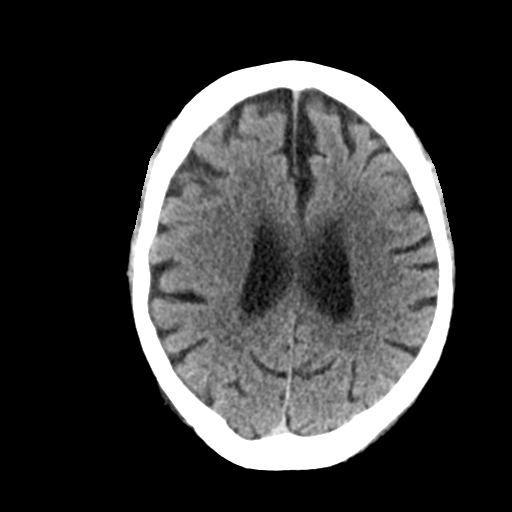
[im 20/30  brain]
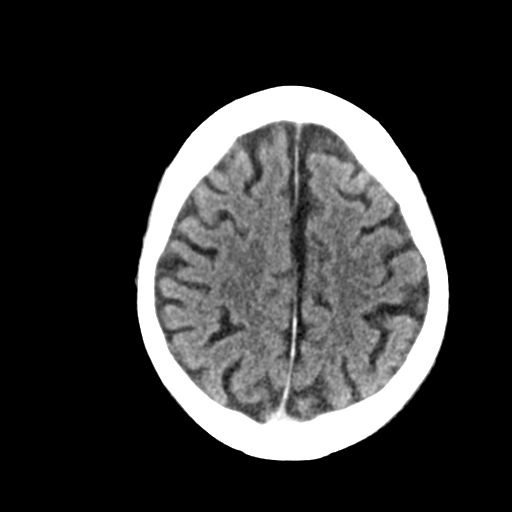
[im 23/30  brain]
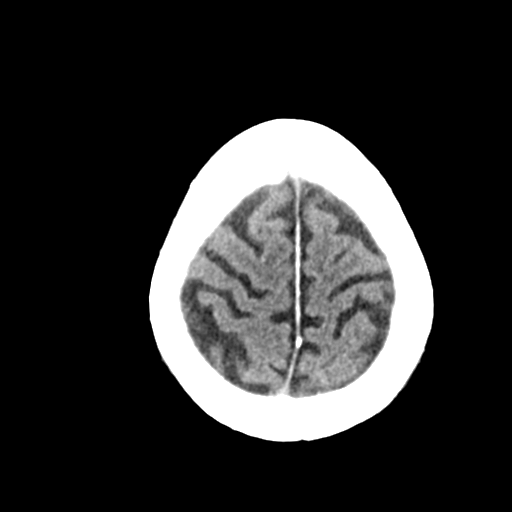
[im 25/30  brain]
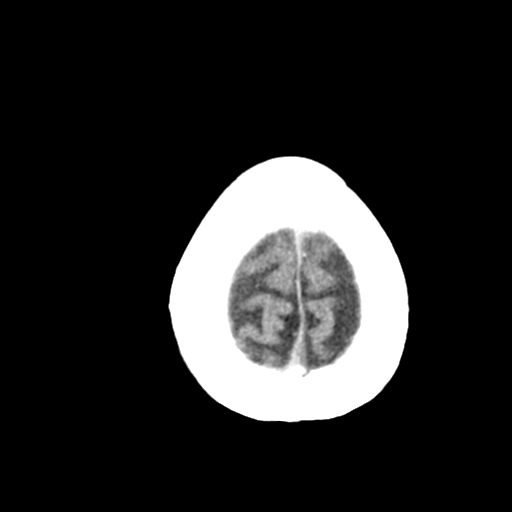
[im 25/30  bone]
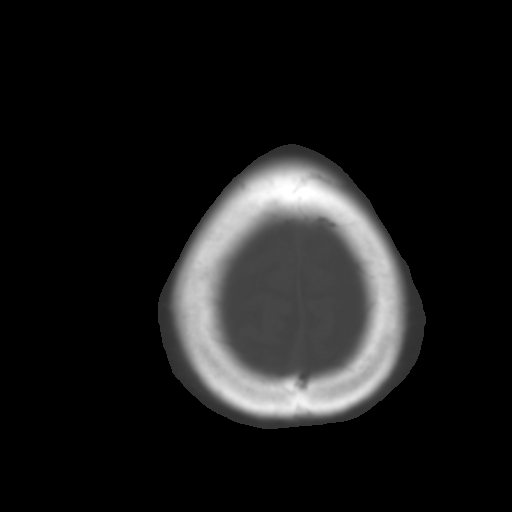
[im 28/30  brain]
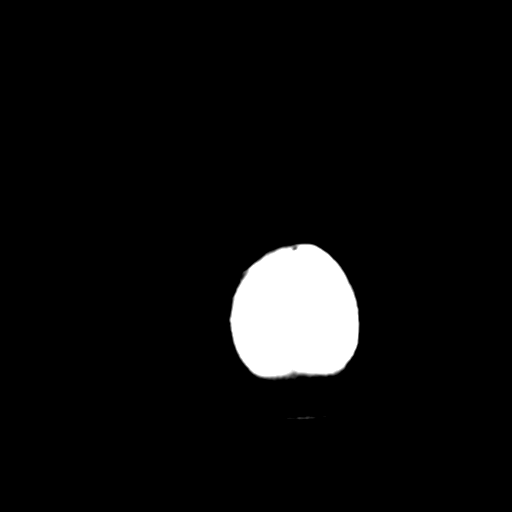

[Series 4: coronal soft tissue · coronal · 0.30mm/px · 3 of 68 slices shown]
[im 23/68  brain]
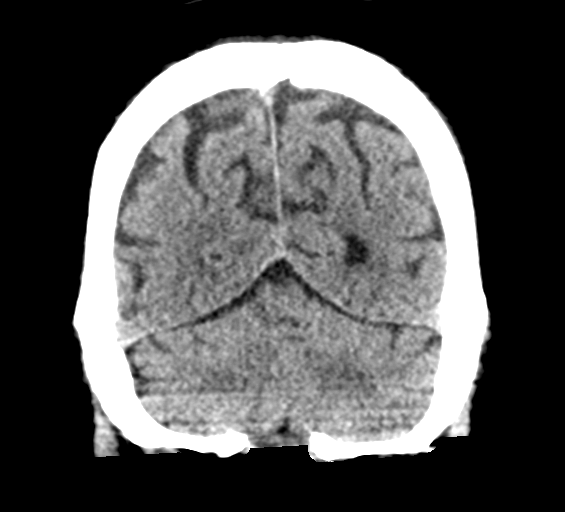
[im 30/68  brain]
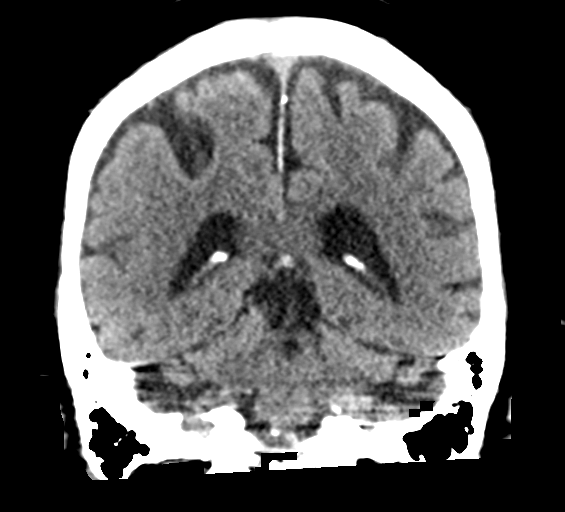
[im 38/68  brain]
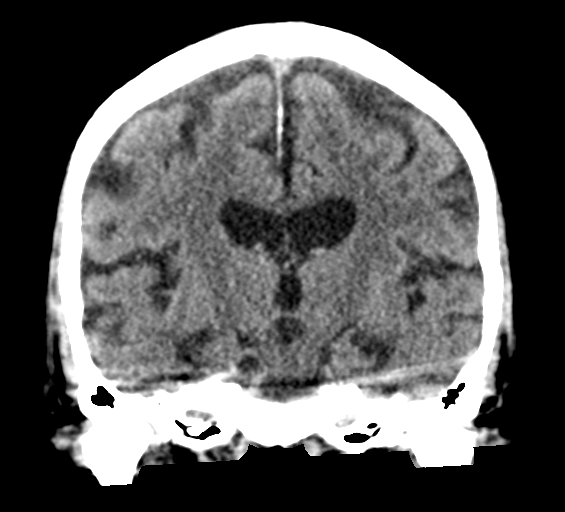

[Series 5: sagittal soft tissue · sagittal · 0.30mm/px · 3 of 50 slices shown]
[im 17/50  brain]
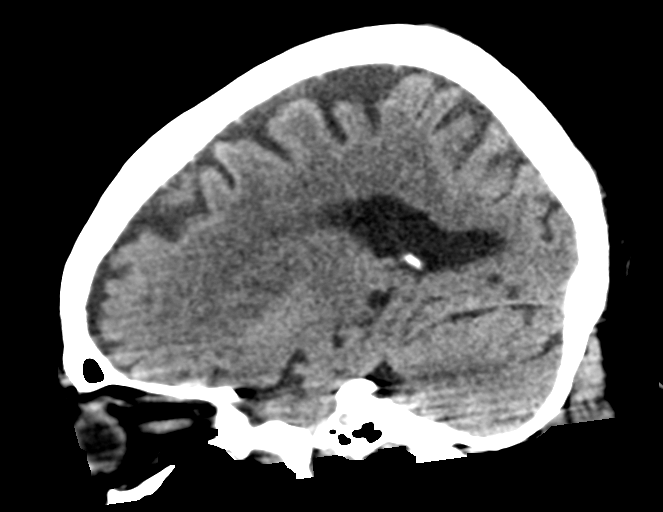
[im 25/50  brain]
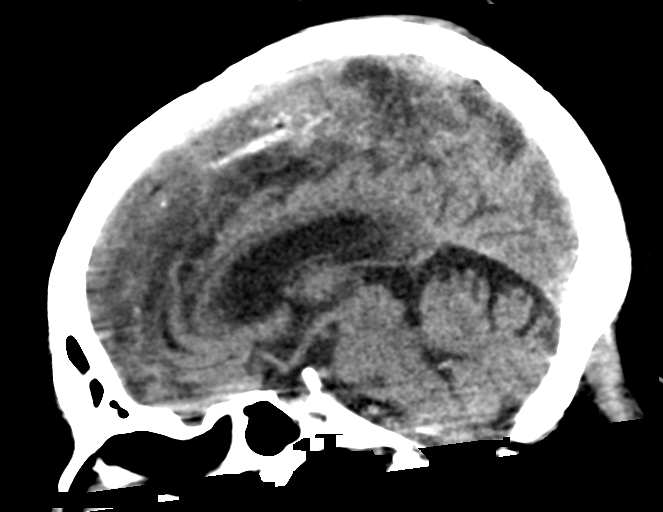
[im 33/50  brain]
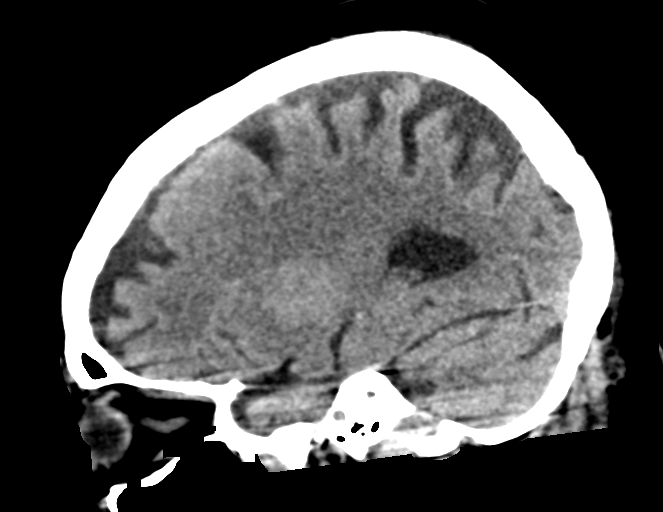

[16 of 47 positions shown; findings below may reference images not displayed]

FINDINGS: Brain: Generalized atrophy. No sign of acute infarction, mass
lesion, hemorrhage, hydrocephalus or extra-axial collection.

Vascular: There is atherosclerotic calcification of the major
vessels at the base of the brain.

Skull: Normal

Sinuses/Orbits: Clear/normal

Other: None
IMPRESSION: No acute finding by CT.  Chronic atrophic changes.

## 2020-07-03 IMAGING — US BILATERAL CAROTID DUPLEX ULTRASOUND
1 series · 14 of 24 positions shown · non-contrast
Comparison: None.

CLINICAL DATA: Altered mental status

EXAM:
BILATERAL CAROTID DUPLEX ULTRASOUND
TECHNIQUE: Gray scale imaging, color Doppler and duplex ultrasound were
performed of bilateral carotid and vertebral arteries in the neck.

[Series 1: bilateral carotid duplex ultrasound · 14 of 66 slices shown]
[im 1/66]
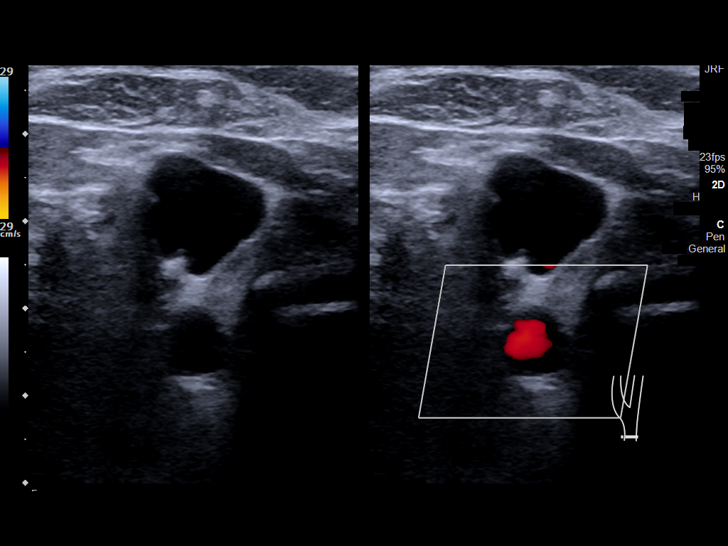
[im 6/66]
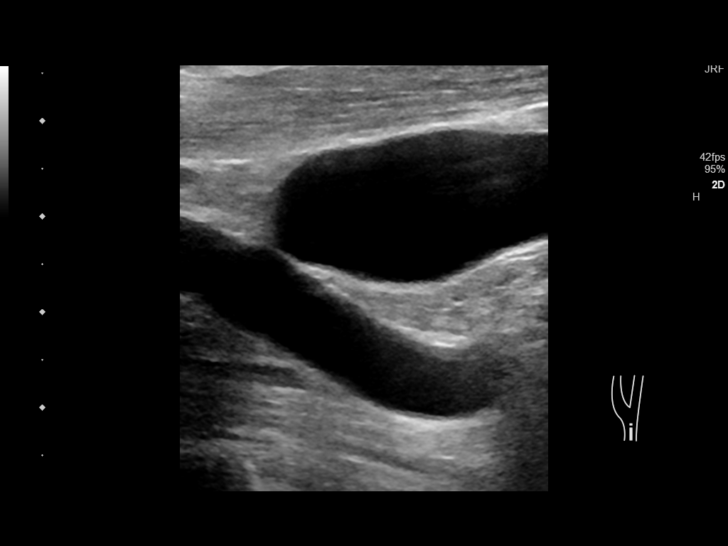
[im 12/66]
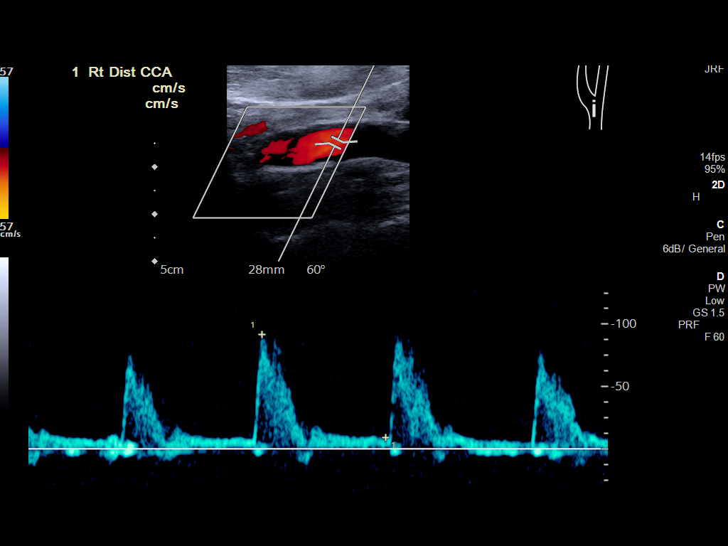
[im 17/66]
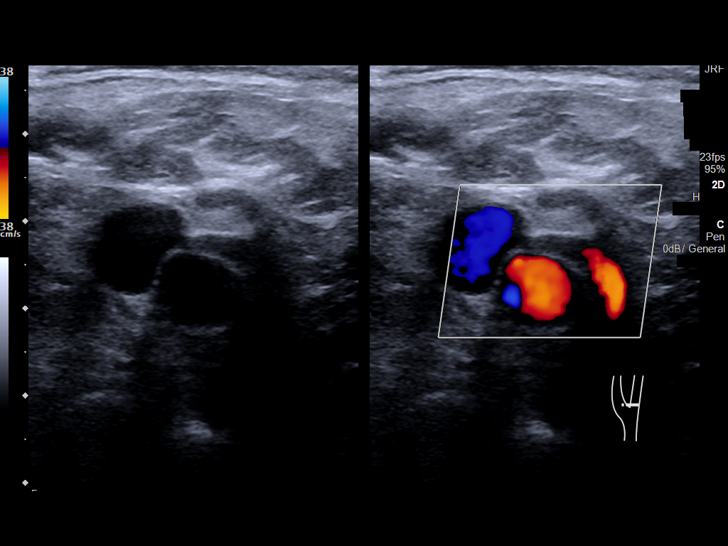
[im 20/66]
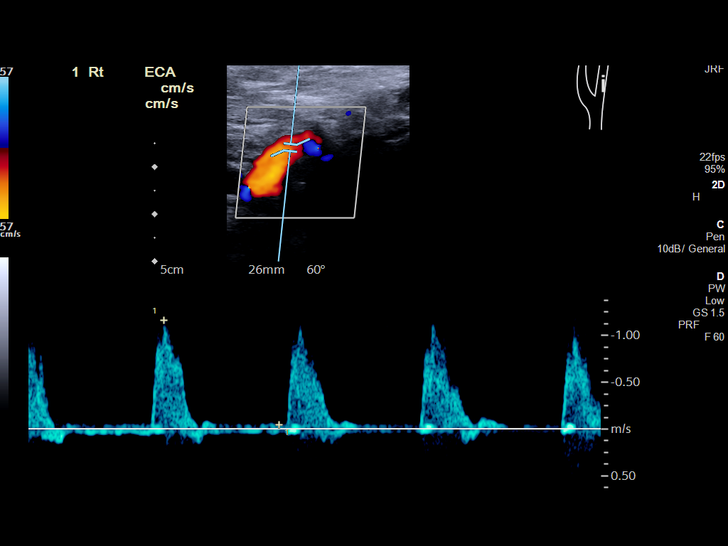
[im 26/66]
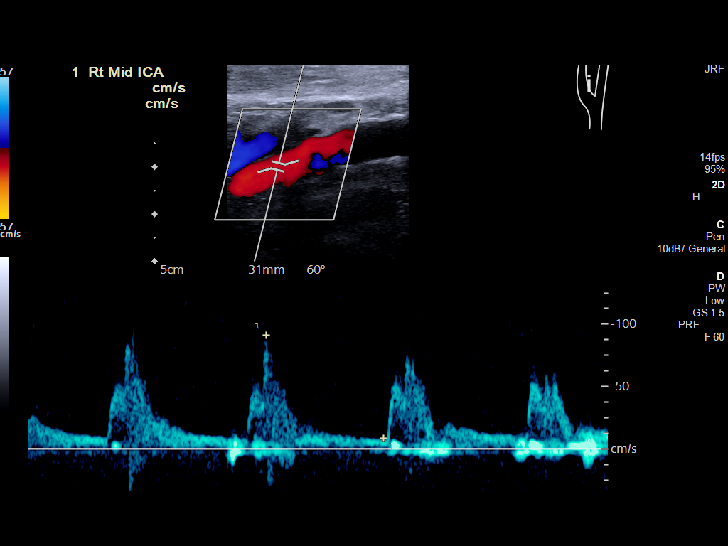
[im 32/66]
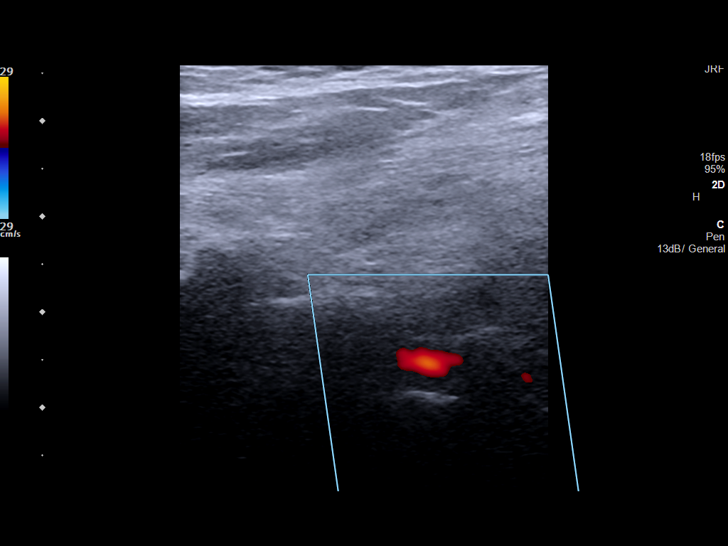
[im 34/66]
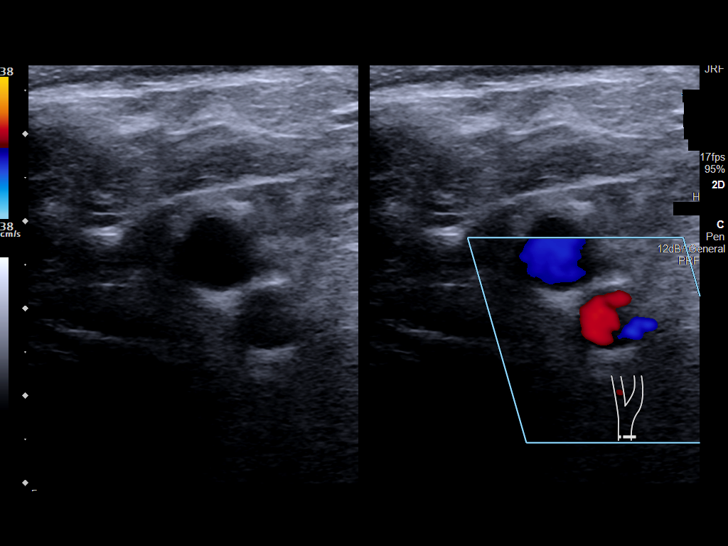
[im 40/66]
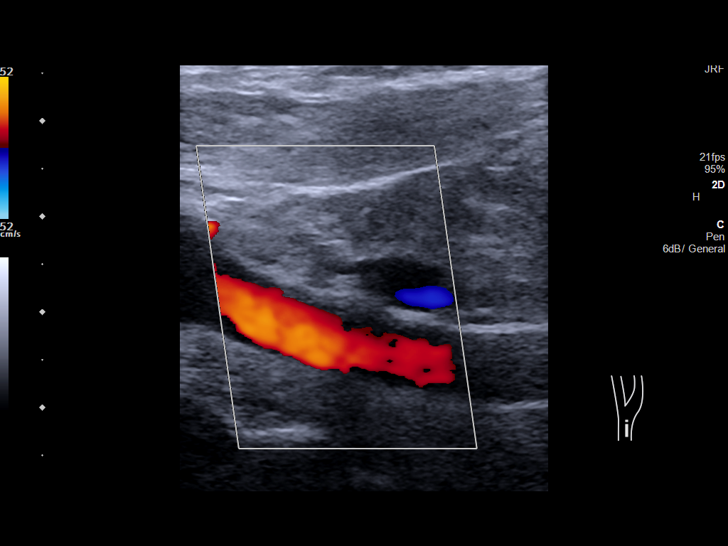
[im 46/66]
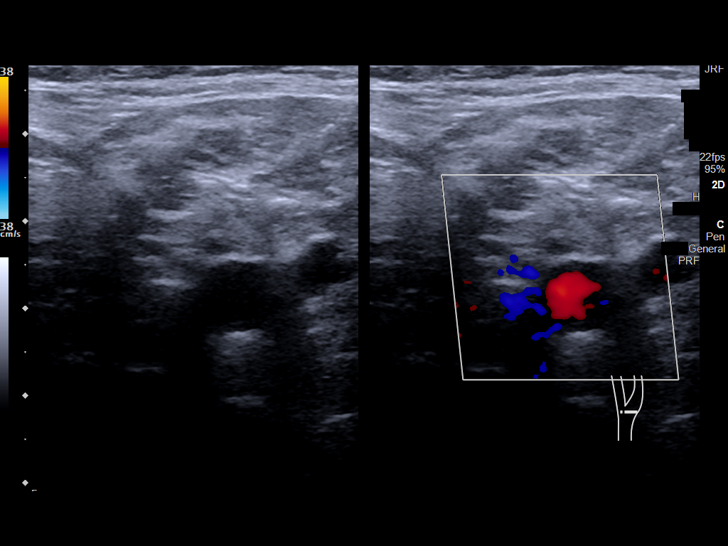
[im 51/66]
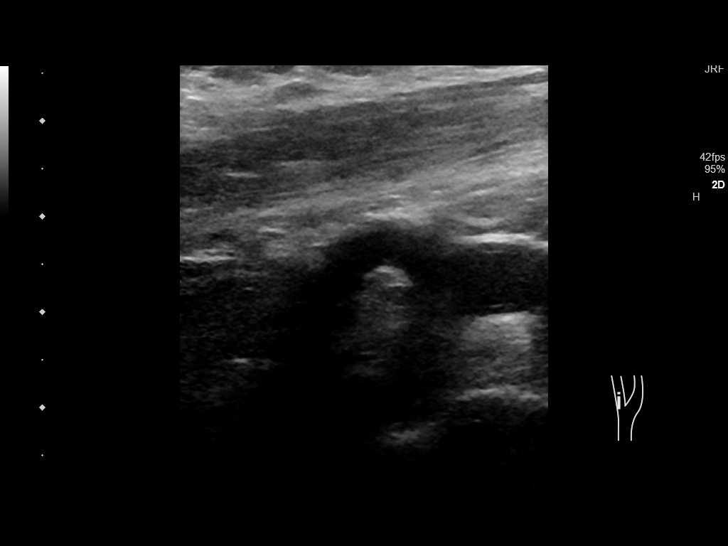
[im 54/66]
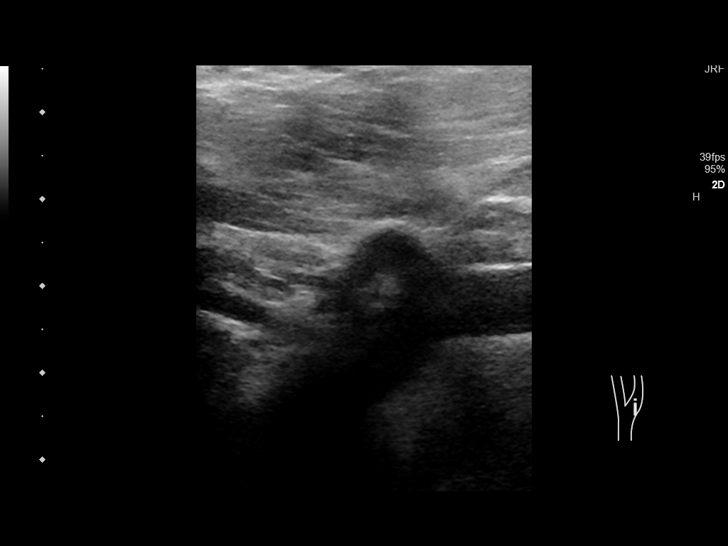
[im 60/66]
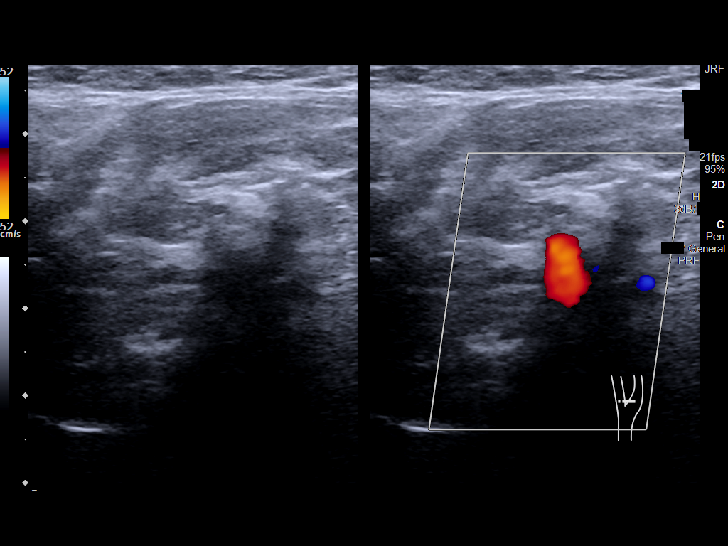
[im 66/66]
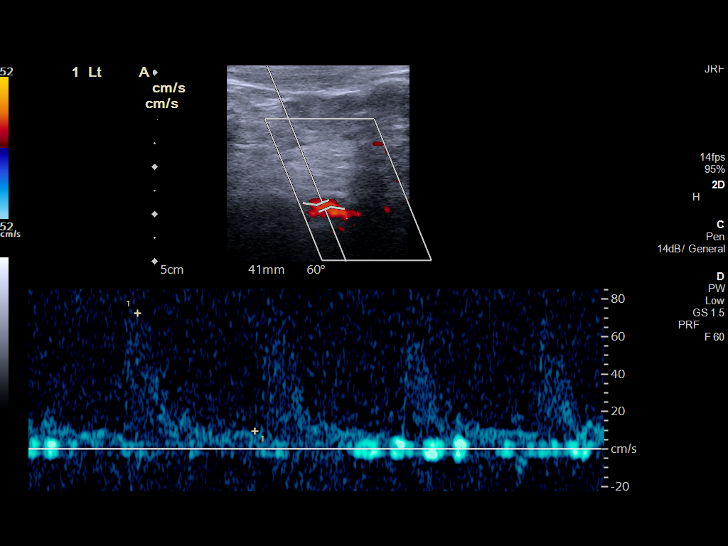

[14 of 24 positions shown; findings below may reference images not displayed]

FINDINGS: Criteria: Quantification of carotid stenosis is based on velocity
parameters that correlate the residual internal carotid diameter
with NASCET-based stenosis levels, using the diameter of the distal
internal carotid lumen as the denominator for stenosis measurement.

The following velocity measurements were obtained:

RIGHT

ICA: 91 cm/sec

CCA: 117 cm/sec

SYSTOLIC ICA/CCA RATIO:

ECA: 116 cm/sec

LEFT

ICA: 126 cm/sec

CCA: 95 cm/sec

SYSTOLIC ICA/CCA RATIO:

ECA: 107 cm/sec

RIGHT CAROTID ARTERY: Minimal plaque in the bulb. Low resistance
internal carotid Doppler pattern is preserved.

RIGHT VERTEBRAL ARTERY:  Antegrade.

LEFT CAROTID ARTERY: Minimal intimal thickening in the bulb. Low
resistance internal carotid Doppler pattern is preserved.

LEFT VERTEBRAL ARTERY:  Antegrade.
IMPRESSION: Less than 50% stenosis in the right and left internal carotid
arteries.

## 2021-03-16 ENCOUNTER — Ambulatory Visit: Payer: Self-pay | Admitting: Family Medicine

## 2021-03-16 ENCOUNTER — Other Ambulatory Visit: Payer: Self-pay

## 2021-03-16 ENCOUNTER — Ambulatory Visit (INDEPENDENT_AMBULATORY_CARE_PROVIDER_SITE_OTHER): Payer: Self-pay | Admitting: Family Medicine

## 2021-03-16 ENCOUNTER — Encounter: Payer: Self-pay | Admitting: Family Medicine

## 2021-03-16 VITALS — BP 144/80 | HR 80 | Ht 66.0 in | Wt 189.0 lb

## 2021-03-16 DIAGNOSIS — Z79899 Other long term (current) drug therapy: Secondary | ICD-10-CM

## 2021-03-16 DIAGNOSIS — Z0189 Encounter for other specified special examinations: Secondary | ICD-10-CM

## 2021-03-16 DIAGNOSIS — G20 Parkinson's disease: Secondary | ICD-10-CM

## 2021-03-16 NOTE — Progress Notes (Signed)
Date:  03/16/2021   Name:  Hannah Jackson   DOB:  Aug 27, 1928   MRN:  [id number]   Chief Complaint: Establish Care (Valerie/ niece is in room with pt- has been "okayed" with pt.)  HPI  Lab Results  Component Value Date   CREATININE 0.60 04/11/2019   BUN 19 04/11/2019   NA 140 04/11/2019   K 4.0 04/11/2019   CL 109 04/11/2019   CO2 24 04/11/2019   No results found for: CHOL, HDL, LDLCALC, LDLDIRECT, TRIG, CHOLHDL Lab Results  Component Value Date   TSH 1.843 02/27/2018   Lab Results  Component Value Date   HGBA1C 5.9 (H) 04/11/2019   Lab Results  Component Value Date   WBC 8.3 04/11/2019   HGB 10.8 (L) 04/12/2019   HCT 35.3 (L) 04/11/2019   MCV 88.9 04/11/2019   PLT 135 (L) 04/11/2019   Lab Results  Component Value Date   ALT 6 04/10/2019   AST 9 (L) 04/10/2019   ALKPHOS 47 04/10/2019   BILITOT 0.9 04/10/2019     Review of Systems  Unable to perform ROS: Age    Patient Active Problem List   Diagnosis Date Noted  . GI bleed 04/10/2019  . AMS (altered mental status) 02/01/2019  . Hypoglycemia 02/26/2018  . UTI (urinary tract infection) 02/20/2018  . COPD (chronic obstructive pulmonary disease) (HCC)   . Asthma exacerbation 02/19/2018  . HTN (hypertension) 02/19/2018  . HLD (hyperlipidemia) 02/19/2018  . Diabetes (HCC) 02/19/2018  . Parkinson's disease (HCC) 02/19/2018    Allergies  Allergen Reactions  . Penicillins     Has patient had a PCN reaction causing immediate rash, facial/tongue/throat swelling, SOB or lightheadedness with hypotension: Unknown Has patient had a PCN reaction causing severe rash involving mucus membranes or skin necrosis: Unknown Has patient had a PCN reaction that required hospitalization: Unknown Has patient had a PCN reaction occurring within the last 10 years: Unknown If all of the above answers are "NO", then may proceed with Cephalosporin use.     Past Surgical History:  Procedure Laterality Date  . BACK SURGERY       Social History   Tobacco Use  . Smoking status: Never Smoker  . Smokeless tobacco: Never Used  Substance Use Topics  . Alcohol use: No  . Drug use: No     Medication list has been reviewed and updated.  Current Meds  Medication Sig  . Acetaminophen (TYLENOL ARTHRITIS PAIN PO) SMARTSIG:1 Tablet(s) By Mouth Twice Daily  . carbidopa-levodopa (SINEMET CR) 50-200 MG tablet Take 1 tablet by mouth 3 (three) times daily.   . carbidopa-levodopa (SINEMET IR) 25-100 MG tablet Take 1 tablet by mouth 3 (three) times daily.  . carboxymethylcellul-glycerin (REFRESH OPTIVE) 0.5-0.9 % ophthalmic solution Place 1-2 drops into both eyes 4 (four) times daily as needed for dry eyes.  . cholecalciferol (VITAMIN D) 1000 units tablet Take 1,000 Units by mouth daily.  . diclofenac sodium (VOLTAREN) 1 % GEL Apply 2 g topically 3 (three) times daily as needed (for wrist pain).  . hydrALAZINE (APRESOLINE) 25 MG tablet Take 25 mg by mouth 4 (four) times daily.  Marland Kitchen. lactose free nutrition (BOOST PLUS) LIQD Take 237 mLs by mouth 3 (three) times daily with meals.  Marland Kitchen. losartan (COZAAR) 25 MG tablet Take 25 mg by mouth daily.  . mirabegron ER (MYRBETRIQ) 25 MG TB24 tablet Take 25 mg by mouth daily.  . pantoprazole (PROTONIX) 40 MG tablet Take 1 tablet (40 mg  total) by mouth 2 (two) times daily before a meal.  . [DISCONTINUED] dextrose (GLUTOSE) 40 % GEL Take 1 Tube by mouth once as needed for low blood sugar.    PHQ 2/9 Scores 03/16/2021  PHQ - 2 Score 1  PHQ- 9 Score 4    GAD 7 : Generalized Anxiety Score 03/16/2021  Nervous, Anxious, on Edge 0  Control/stop worrying 0  Worry too much - different things 0  Trouble relaxing 0  Restless 0  Easily annoyed or irritable 0    BP Readings from Last 3 Encounters:  03/16/21 (!) 144/80  04/12/19 (!) 131/59  02/04/19 (!) 148/59    Physical Exam Vitals and nursing note reviewed.     Wt Readings from Last 3 Encounters:  03/16/21 189 lb (85.7 kg)  04/10/19  200 lb (90.7 kg)  02/04/19 200 lb (90.7 kg)    BP (!) 144/80   Pulse 80   Ht 5\' 6"  (1.676 m)   Wt 189 lb (85.7 kg)   BMI 30.51 kg/m   Assessment and Plan:  1. Encounter for geriatric assessment Rather extensive encounter to discuss multiple things about what would be involved for transfer of care of this patient. 1.  Noted that it is rare that we would have a new patient that was not transferring into the area with establishment of a new doctor as understand wanted to leave present facility at [facility] and to be taken at home that she would have independent living and concerns about being overmedicated.                            2.  That if I was to be involved with this that I would likely involve geriatrics and with some capacity from one of the medical centers.                                             3.  I would definitely insist on involving neurology for the evaluation and maintenance of medical approach for her parkinsonism given that this is outside of my area of expertise.                                                             4.  Medication review: It was noted that patient was only on 8 medications 3 of them prescription in 2 oh over-the-counter which from the original polypharmacy that she had that we could see from 4 to 5 years ago was in excess and had already been deprescribed to the point where it is at probably the minimum.  Patient was on 2 dosages of the same medication for control of her parkinsonism, 2 medications for her blood pressure control which was acceptable at 144/80, to over-the-counter medications including Tylenol and vitamin D, 1 medication for overactive bladder, and one medication for GERD.  I explained to the relative that this is an excellent grouping of medications that they all were appropriate and that I would not say that this is an overmedicated circumstance.  5.  Altered mental status this  is likely due to her parkinsonism and that I do not feel she is overmedicated on this but this is out of my area of expertise of the level of control and would defer this to the decision of neurology.                                                6.  As far as where she is at that [facility] which apparently is a care facility I waited agree that she needs 24 hours of awareness, with access to physical therapy and Occupational Therapy socialization and means of care and transfer.  That she be in a facility that has equipment that would be able to do this safely.  In a facility that is able to provide shower facilities without risk of fall.                     7.  For the request that they would like to get her up and walking this would have to be done with assistance I would not do it without supervision and without significant support on either side and that even with standing she is at risk for fall and is certainly at risk for fall if she was ambulating on her own power.     8.  I do not have access to her medical care for the past several years because the facility that she is currently had is not on epic and I cannot review.  But at this point in time it would appear that everything is appropriate and that I would not have anything further to offer than what she is already receiving.  But I would reserve this decision only after careful review of any previous care concerns.   2. Encounter for medication review As noted medications were reviewed each medication was discussed it was written on the sheet what each medication is for and that the dosing to the best of my ability as far is the parkinsonism would be appropriate but more likely to be best decided by neurology.  3. Parkinson's disease (HCC) Patient has parkinsonism and there is likely a degree of involvement affecting her cognition and that this is being controlled by the medication to what extent I am not certain but I do not feel that she is being  inappropriately treated.  This discussion was held with the aunt/sister while my nurse discussed this with the remainder of the family and there was also some discussion with the family by my office manager as well.  I spent 58 minutes with this patient, More than 50% of that time was spent in face to face education, counseling and care coordination.

## 2021-07-25 ENCOUNTER — Telehealth: Payer: Self-pay | Admitting: Family Medicine

## 2021-07-25 NOTE — Telephone Encounter (Signed)
Copied from CRM [id number]. Topic: Medicare AWV >> Jul 25, 2021  4:46 PM Claudette Laws R wrote: Reason for CRM:  Left message for patient to call back and schedule Medicare Annual Wellness Visit (AWV) in office.   If unable to come into the office for AWV,  please offer to do virtually or by telephone.  No hx of AWV eligible for AWVI as of 11/19/2009  Please schedule at anytime with [facility] Health Advisor.      40 Minutes appointment   Any questions, please call me at [phone]

## 2021-11-28 ENCOUNTER — Telehealth: Payer: Self-pay | Admitting: Family Medicine

## 2021-11-28 NOTE — Telephone Encounter (Signed)
Copied from CRM [id number]. Topic: Medicare AWV >> Nov 28, 2021  1:24 PM Claudette Laws R wrote: Reason for CRM:  Left message for patient to call back and schedule Medicare Annual Wellness Visit (AWV) in office.   If unable to come into the office for AWV,  please offer to do virtually or by telephone.  No hx of AWV eligible for AWVI as of 11/19/2009 per [employer]  Please schedule at anytime with [facility] Health Advisor.      40 Minutes appointment   Any questions, please call me at [phone]

## 2022-07-25 ENCOUNTER — Other Ambulatory Visit: Payer: Self-pay | Admitting: Neurology

## 2022-07-25 DIAGNOSIS — G20A1 Parkinson's disease without dyskinesia, without mention of fluctuations: Secondary | ICD-10-CM

## 2022-07-25 DIAGNOSIS — G20 Parkinson's disease: Secondary | ICD-10-CM

## 2022-08-02 ENCOUNTER — Other Ambulatory Visit: Payer: Self-pay | Admitting: Internal Medicine

## 2022-08-06 ENCOUNTER — Other Ambulatory Visit: Payer: Self-pay | Admitting: Internal Medicine

## 2022-08-06 DIAGNOSIS — R1310 Dysphagia, unspecified: Secondary | ICD-10-CM

## 2022-08-22 ENCOUNTER — Ambulatory Visit
Admission: RE | Admit: 2022-08-22 | Discharge: 2022-08-22 | Disposition: A | Discharge status: Home / Self Care | Payer: Medicare Other | Source: Ambulatory Visit | Attending: Neurology | Admitting: Neurology

## 2022-08-22 DIAGNOSIS — F02C2 Dementia in other diseases classified elsewhere, severe, with psychotic disturbance: Secondary | ICD-10-CM | POA: Diagnosis present

## 2022-08-22 DIAGNOSIS — G20A1 Parkinson's disease without dyskinesia, without mention of fluctuations: Secondary | ICD-10-CM | POA: Insufficient documentation

## 2022-09-04 ENCOUNTER — Ambulatory Visit
Admission: RE | Admit: 2022-09-04 | Discharge: 2022-09-04 | Disposition: A | Discharge status: Home / Self Care | Payer: Medicare Other | Source: Ambulatory Visit | Attending: Internal Medicine | Admitting: Internal Medicine

## 2022-09-04 DIAGNOSIS — R1310 Dysphagia, unspecified: Secondary | ICD-10-CM | POA: Diagnosis present

## 2022-09-04 NOTE — Therapy (Addendum)
[facility] [place], [place], [address] Phone: [phone]   Fax:     Modified Barium Swallow  Patient Details  Name: Hannah Jackson MRN: [id number] Date of Birth: 17-Aug-1928 No data recorded  Encounter Date: 09/04/2022   End of Session - 09/04/22 1549     Visit Number 1    Number of Visits 1    Date for SLP Re-Evaluation 09/04/22    SLP Start Time 1305    SLP Stop Time  1410    SLP Time Calculation (min) 65 min    Activity Tolerance Patient tolerated treatment well             Past Medical History:  Diagnosis Date   Asthma    Diabetes mellitus without complication ([place])    on metformin   GERD (gastroesophageal reflux disease)    HLD (hyperlipidemia)    HTN (hypertension)    Parkinson's disease ([place])     Past Surgical History:  Procedure Laterality Date   BACK SURGERY      There were no vitals filed for this visit.   Subjective: Patient behavior: (alertness, ability to follow instructions, etc.): pt was pleasant; verbal and answered few basic, direct questions re: self. She followed basic 1-step commands. Sister and Caregiver present today. Chief complaint: dysphagia. Pt has medical history including severe Dementia per Neurology(07/18/22), Parkinson's Dis, Esophageal stenosis per chart. NO reports of pneumonia -- last CXR was in 2020(Negative). No report of cva. Pt has Caregiver and Family support in the home -- foods are prepared for pt. Pt is on a PPI per chart; baseline of GERD.   OM Exam: WFL w/ No unilateral weakness. Native Dentition; missing few. Cough - strong.    Objective:  Radiological Procedure: A videoflouroscopic evaluation of oral-preparatory, reflex initiation, and pharyngeal phases of the swallow was performed; as well as a screening of the upper esophageal phase.  POSTURE: upright VIEW: lateral COMPENSATORY STRATEGIES: small, single sips BOLUSES ADMINISTERED:  Thin  Liquid: 2 tsp trials; 8 Cup trials  Nectar-thick Liquid: 1 tsp trial; 2 Cup trials  Honey-thick Liquid: NT  Puree: 1 trial  Mechanical Soft: 1 trial RESULTS OF EVALUATION: ORAL PREPARATORY PHASE: (The lips, tongue, and velum are observed for strength and coordination)       **Overall Severity Rating: grossly WFL. Min extra time for full mastication of increased textured trial; full oral clearing b/t all swallows of trial consistencies.   SWALLOW INITIATION/REFLEX: (The reflex is normal if "triggered" by the time the bolus reached the base of the tongue)  **Overall Severity Rating: grossly WFL for age -- mildly delayed pharyngeal swallow initiation noted inconsistently w/ thin liquids as they spilled into the pharynx. This resulted in delayed epiglottic inversion and slightly decreased, tight airway closure/protection w/ "flash", transient laryngeal penetration noted which appeared to clear spontaneously w/ completion of the swallow. NO aspiration was noted to occur during this study.   PHARYNGEAL PHASE: (Pharyngeal function is normal if the bolus shows rapid, smooth, and continuous transit through the pharynx and there is no pharyngeal residue after the swallow)  **Overall Severity Rating: [facility].   LARYNGEAL PENETRATION: (Material entering into the laryngeal inlet/vestibule but not aspirated): x3 - transient and cleared spontaneously w/ the swallow ASPIRATION: NONE ESOPHAGEAL PHASE: (Screening of the upper esophagus): no obvious dysmotility noted; adequate bolus transit through UES and upper, cervical esophagus(viewable)   ASSESSMENT:    PLAN/RECOMMENDATIONS:  A. Diet: more of a Mech Soft  diet = foods Cut small and Moistened well for ease of chewing/mastication; Thin liquids via Cup = Small Sips.  Pills Given in Puree for safer, easier swallowing as needed.  B. Swallowing Precautions: general aspiration precautions  C. Recommended consultation to: GI for assessment, management of GERD, and  education on Esophageal phase complaints of food "sticking here" - pointing to her mid-sternum area; Dietician f/u if desired  D. Therapy recommendations: None  E. Results and recommendations were discussed w/ patient, Sister, and pt's Caregiver present; questions answered re: the evaluation and the recommendations for general aspiration precautions. All agreed.       Dysphagia, unspecified type - Plan: DG SWALLOW FUNC OP MEDICARE SPEECH PATH, DG SWALLOW FUNC OP MEDICARE SPEECH PATH        Problem List Patient Active Problem List   Diagnosis Date Noted   GI bleed 04/10/2019   AMS (altered mental status) 02/01/2019   Hypoglycemia 02/26/2018   UTI (urinary tract infection) 02/20/2018   COPD (chronic obstructive pulmonary disease) ([place])    Asthma exacerbation 02/19/2018   HTN (hypertension) 02/19/2018   HLD (hyperlipidemia) 02/19/2018   Diabetes ([place]) 02/19/2018   Parkinson's disease 02/19/2018          Orinda Kenner, MS, CCC-SLP Speech Language Pathologist Re[facility]; [place] [phone] ([address]) [place], [place] 09/04/2022, 3:50 PM  [place], [place], [address] Phone: [phone]   Fax:     Name: Hannah Jackson MRN: [id number] Date of Birth: 07-22-1928

## 2022-09-09 ENCOUNTER — Ambulatory Visit
Admission: EM | Admit: 2022-09-09 | Discharge: 2022-09-09 | Disposition: A | Discharge status: Home / Self Care | Payer: Medicare Other | Attending: Family Medicine | Admitting: Family Medicine

## 2022-09-09 ENCOUNTER — Ambulatory Visit (INDEPENDENT_AMBULATORY_CARE_PROVIDER_SITE_OTHER): Payer: Medicare Other

## 2022-09-09 DIAGNOSIS — W19XXXA Unspecified fall, initial encounter: Secondary | ICD-10-CM | POA: Diagnosis not present

## 2022-09-09 DIAGNOSIS — S8002XA Contusion of left knee, initial encounter: Secondary | ICD-10-CM | POA: Diagnosis not present

## 2022-09-09 DIAGNOSIS — R52 Pain, unspecified: Secondary | ICD-10-CM | POA: Diagnosis not present

## 2022-09-09 NOTE — ED Triage Notes (Addendum)
Pt accompanied by sister, states patient fell out of bed last night, sister states pt stays with her other siblings. Sister states patient does have a bruise on one of her thighs but does not remember which one

## 2022-09-09 NOTE — Discharge Instructions (Addendum)
The x-rays of her knee were normal.  If she has another unwitnessed fall, it would be important that she is seen immediately after the fall.  If she starts complaining of a headache, increasing confusion or new difficulty walking, take her to the emergency department immediately.  She can take Tylenol 1000 mg 3 times a day as needed for pain.  Follow-up with her primary care doctor as scheduled.

## 2022-09-09 NOTE — ED Provider Notes (Signed)
[facility]    CSN: [id number] Arrival date & time: 09/09/22  1434      History   Chief Complaint Chief Complaint  Patient presents with   Fall    HPI Hannah Jackson is a 88 y.o. female.   HPI  Hannah Jackson brought in by her sister after unwitnessed fall yesterday around 5 AM.  States that the family found her by the bedside and had to help her get up.  She stays with another brother.  Patient denies hitting her head or having a headache.  There was a walker nearby and a dresser in the room but does not think that it was near where she was.  She has a bed with rails at home but the rails do not cover the entire bed.  Does not have a pad down where she was.  Sister notes that Hannah Jackson has a bruise on one of her legs but she cannot remember which one.  Hannah Jackson was able to feed herself this morning.  She has not had any vomiting.  She has intermittent diarrhea but this is not new.  She uses a walker at baseline.   Past Medical History:  Diagnosis Date   Asthma    Diabetes mellitus without complication ([facility])    on metformin   GERD (gastroesophageal reflux disease)    HLD (hyperlipidemia)    HTN (hypertension)    Parkinson's disease     Patient Active Problem List   Diagnosis Date Noted   GI bleed 04/10/2019   AMS (altered mental status) 02/01/2019   Hypoglycemia 02/26/2018   UTI (urinary tract infection) 02/20/2018   COPD (chronic obstructive pulmonary disease) ([facility])    Asthma exacerbation 02/19/2018   HTN (hypertension) 02/19/2018   HLD (hyperlipidemia) 02/19/2018   Diabetes ([facility]) 02/19/2018   Parkinson's disease 02/19/2018    Past Surgical History:  Procedure Laterality Date   BACK SURGERY      OB History   No obstetric history on file.      Home Medications    Prior to Admission medications   Medication Sig Start Date End Date Taking? Authorizing Provider  Acetaminophen (TYLENOL ARTHRITIS PAIN PO) SMARTSIG:1 Tablet(s) By Mouth Twice Daily 01/12/21    [provider]  carbidopa-levodopa (SINEMET CR) 50-200 MG tablet Take 1 tablet by mouth 3 (three) times daily.     [provider]  carbidopa-levodopa (SINEMET IR) 25-100 MG tablet Take 1 tablet by mouth 3 (three) times daily.    [provider]  carboxymethylcellul-glycerin (REFRESH OPTIVE) 0.5-0.9 % ophthalmic solution Place 1-2 drops into both eyes 4 (four) times daily as needed for dry eyes.    [provider]  cholecalciferol (VITAMIN D) 1000 units tablet Take 1,000 Units by mouth daily.    [provider]  diclofenac sodium (VOLTAREN) 1 % GEL Apply 2 g topically 3 (three) times daily as needed (for wrist pain).    [provider]  hydrALAZINE (APRESOLINE) 25 MG tablet Take 25 mg by mouth 4 (four) times daily.    [provider]  ibandronate (BONIVA) 150 MG tablet TAKE 1 TABLET BY MOUTH ON 1ST MONDAY OF MONTH 60 MINS BEFORE FIRST FOOD/DRINK 06/19/22   [provider]  lactose free nutrition (BOOST PLUS) LIQD Take 237 mLs by mouth 3 (three) times daily with meals. 02/24/18   Auburn Bilberry, MD  losartan (COZAAR) 25 MG tablet Take 25 mg by mouth daily.    [provider]  mirabegron ER (MYRBETRIQ) 25  MG TB24 tablet Take 25 mg by mouth daily.    [provider]  pantoprazole (PROTONIX) 40 MG tablet Take 1 tablet (40 mg total) by mouth 2 (two) times daily before a meal. 04/12/19   Shaune Pollack, MD    Family History Family History  Problem Relation Age of Onset   Leukemia Mother    Stroke Mother    CAD Father    Hypertension Father    Heart attack Father     Social History Social History   Tobacco Use   Smoking status: Never   Smokeless tobacco: Never  Substance Use Topics   Alcohol use: No   Drug use: No     Allergies   Penicillins   Review of Systems Review of Systems : negative unless otherwise stated in HPI.      Physical Exam Triage Vital Signs ED Triage Vitals  Enc Vitals Group      BP 09/09/22 1530 138/68     Pulse Rate 09/09/22 1530 (!) 56     Resp --      Temp 09/09/22 1530 98.9 F (37.2 C)     Temp Source 09/09/22 1530 Oral     SpO2 09/09/22 1530 100 %     Weight --      Height 09/09/22 1526 5\' 3"  (1.6 m)     Head Circumference --      Peak Flow --      Pain Score --      Pain Loc --      Pain Edu? --      Excl. in GC? --    No data found.  Updated Vital Signs BP 138/68 (BP Location: Left Arm)   Pulse (!) 56   Temp 98.9 F (37.2 C) (Oral)   Ht 5\' 3"  (1.6 m)   SpO2 100%   BMI 33.48 kg/m   Visual Acuity Right Eye Distance:   Left Eye Distance:   Bilateral Distance:    Right Eye Near:   Left Eye Near:    Bilateral Near:     Physical Exam  GEN: alert, elderly female, in no acute distress     HENT:  mucus membranes moist, no tongue lesions EYES:   pupils equal and reactive, EOM intact NECK:  supple, normal ROM, no swelling C-spine tenderness RESP:  clear to auscultation bilaterally, no increased work of breathing   CVS:   regular rate and rhythm EXT:  Able to flex and extend her knees.  No bony tenderness of the knees or shoulders.  She is able to put weight on both of her legs with assistance, pelvis stable, no midline thoracic or lumbar tenderness NEURO: Oriented to person, city not time (this is not new), normal speech, no facial droop, moving all extremities appropriately Skin:   warm and dry, left knee abrasion Psych: Normal affect, appropriate speech and behavior    UC Treatments / Results  Labs (all labs ordered are listed, but only abnormal results are displayed) Labs Reviewed - No data to display  EKG   Radiology DG Knee Complete 4 Views Left  Result Date: 09/09/2022 CLINICAL DATA:  Trauma, fall, pain EXAM: LEFT KNEE - COMPLETE 4+ VIEW COMPARISON:  None Available. FINDINGS: No recent displaced fracture or dislocation is seen. There is no significant effusion. Degenerative changes are noted with bony spurs in medial,  lateral and patellofemoral compartments. Chondrocalcinosis is seen in the medial and lateral menisci. Scattered arterial calcifications are seen in soft tissues. IMPRESSION:  No recent fracture is seen in left knee. Moderate to severe degenerative changes are noted. Electronically Signed   By: Elmer Picker M.D.   On: 09/09/2022 16:30    Procedures Procedures (including critical care time)  Medications Ordered in UC Medications - No data to display  Initial Impression / Assessment and Plan / UC Course  I have reviewed the triage vital signs and the nursing notes.  Pertinent labs & imaging results that were available during my care of the patient were reviewed by me and considered in my medical decision making (see chart for details).     Patient is a 88 year old female with dementia who had an unwitnessed fall yesterday morning. Recommended CT head in the ED however sister declined.  Risks of not having a head CT discussed with the sister and she expressed understanding.  Fall was about 36 hours ago.  She is mentally at her baseline.  She has been able to eat since this event.  She has a walker at baseline.  Obtained left knee x-rays that were unremarkable for fracture or dislocation.  Radiologist notes moderately severe osteoarthritis.  Tylenol as needed for pain.  She has a appointment with her primary care provider sometime this week.  ED precautions given and sister voiced understanding.   Final Clinical Impressions(s) / UC Diagnoses   Final diagnoses:  Fall, initial encounter  Contusion of left knee, initial encounter     Discharge Instructions      The x-rays of her knee were normal.  If she has another unwitnessed fall, it would be important that she is seen immediately after the fall.  If she starts complaining of a headache, increasing confusion or new difficulty walking, take her to the emergency department immediately.  She can take Tylenol 1000 mg 3 times a day as  needed for pain.  Follow-up with her primary care doctor as scheduled.     ED Prescriptions   None    PDMP not reviewed this encounter.   Lyndee Hensen, DO 09/09/22 1744

## 2022-09-30 ENCOUNTER — Emergency Department
Admission: EM | Admit: 2022-09-30 | Discharge: 2022-09-30 | Disposition: A | Discharge status: Home / Self Care | Payer: Medicare Other | Attending: Emergency Medicine | Admitting: Emergency Medicine

## 2022-09-30 ENCOUNTER — Emergency Department: Payer: Medicare Other

## 2022-09-30 ENCOUNTER — Other Ambulatory Visit: Payer: Self-pay

## 2022-09-30 ENCOUNTER — Encounter: Payer: Self-pay | Admitting: Emergency Medicine

## 2022-09-30 DIAGNOSIS — G20A1 Parkinson's disease without dyskinesia, without mention of fluctuations: Secondary | ICD-10-CM | POA: Insufficient documentation

## 2022-09-30 DIAGNOSIS — E119 Type 2 diabetes mellitus without complications: Secondary | ICD-10-CM | POA: Diagnosis not present

## 2022-09-30 DIAGNOSIS — J45909 Unspecified asthma, uncomplicated: Secondary | ICD-10-CM | POA: Insufficient documentation

## 2022-09-30 DIAGNOSIS — R001 Bradycardia, unspecified: Secondary | ICD-10-CM

## 2022-09-30 DIAGNOSIS — I10 Essential (primary) hypertension: Secondary | ICD-10-CM | POA: Insufficient documentation

## 2022-09-30 DIAGNOSIS — R0602 Shortness of breath: Secondary | ICD-10-CM

## 2022-09-30 LAB — CBC WITH DIFFERENTIAL/PLATELET
Abs Immature Granulocytes: 0.02 10*3/uL (ref 0.00–0.07)
Basophils Absolute: 0.1 10*3/uL (ref 0.0–0.1)
Basophils Relative: 1 %
Eosinophils Absolute: 0.4 10*3/uL (ref 0.0–0.5)
Eosinophils Relative: 5 %
HCT: 43.3 % (ref 36.0–46.0)
Hemoglobin: 13.4 g/dL (ref 12.0–15.0)
Immature Granulocytes: 0 %
Lymphocytes Relative: 10 %
Lymphs Abs: 0.8 10*3/uL (ref 0.7–4.0)
MCH: 27.3 pg (ref 26.0–34.0)
MCHC: 30.9 g/dL (ref 30.0–36.0)
MCV: 88.4 fL (ref 80.0–100.0)
Monocytes Absolute: 0.4 10*3/uL (ref 0.1–1.0)
Monocytes Relative: 5 %
Neutro Abs: 7 10*3/uL (ref 1.7–7.7)
Neutrophils Relative %: 79 %
Platelets: 227 10*3/uL (ref 150–400)
RBC: 4.9 MIL/uL (ref 3.87–5.11)
RDW: 13.3 % (ref 11.5–15.5)
WBC: 8.7 10*3/uL (ref 4.0–10.5)
nRBC: 0 % (ref 0.0–0.2)

## 2022-09-30 LAB — TROPONIN I (HIGH SENSITIVITY)
Troponin I (High Sensitivity): 7 ng/L (ref <18)
Troponin I (High Sensitivity): 7 ng/L (ref <18)

## 2022-09-30 LAB — BASIC METABOLIC PANEL
Anion gap: 5 (ref 5–15)
BUN: 16 mg/dL (ref 8–23)
CO2: 28 mmol/L (ref 22–32)
Calcium: 8.8 mg/dL — ABNORMAL LOW (ref 8.9–10.3)
Chloride: 107 mmol/L (ref 98–111)
Creatinine, Ser: 0.63 mg/dL (ref 0.44–1.00)
GFR, Estimated: >60 mL/min (ref >60)
Glucose, Bld: 81 mg/dL (ref 70–99)
Potassium: 4.1 mmol/L (ref 3.5–5.1)
Sodium: 140 mmol/L (ref 135–145)

## 2022-09-30 MED ORDER — ALBUTEROL SULFATE (2.5 MG/3ML) 0.083% IN NEBU: 2.5000 mg | INHALATION_SOLUTION | RESPIRATORY_TRACT | 0 refills | Status: DC | PRN

## 2022-09-30 MED ORDER — COMPRESSOR/NEBULIZER MISC: 1.0000 | 0 refills | Status: DC

## 2022-09-30 NOTE — ED Triage Notes (Signed)
Patient to ED via [facility] from home for SOB. Patient's CNA stated to EMS that she was wheezing. Hx of COPD, dementia. Patient alert to self but is baseline per EMS. NAD noted. Respirations even and unlabored.

## 2022-09-30 NOTE — ED Notes (Signed)
[place], MD, made aware of high BP and low HR.

## 2022-09-30 NOTE — ED Provider Notes (Signed)
[facility] Provider Note    Event Date/Time   First MD Initiated Contact with Patient 09/30/22 [phone]     (approximate)   History   Chief Complaint Shortness of Breath   HPI  Hannah Jackson is a 88 y.o. female with past medical history of hypertension, hyperlipidemia, diabetes, asthma, and Parkinson disease who presents to the ED complaining of shortness of breath.  History is limited due to cognitive deficits at baseline per EMS, however she is reported to be at her baseline mental status.  Patient's caregiver called EMS earlier today due to concern that she was having trouble breathing with wheezing.  Patient currently denies any complaints, specifically denies any areas of pain or any difficulty breathing.  EMS reports that patient breathing comfortably on room air during transport.     Physical Exam   Triage Vital Signs: ED Triage Vitals  Enc Vitals Group     BP --      Pulse --      Resp --      Temp --      Temp src --      SpO2 09/30/22 0949 96 %     Weight --      Height --      Head Circumference --      Peak Flow --      Pain Score 09/30/22 0951 0     Pain Loc --      Pain Edu? --      Excl. in GC? --     Most recent vital signs: Vitals:   09/30/22 1100 09/30/22 1200  BP: (!) 197/67 (!) 185/63  Pulse: (!) 45 (!) 44  Resp: 16 16  Temp:    SpO2: 98% 99%    Constitutional: Alert and oriented to person and place, but not time or situation. Eyes: Conjunctivae are normal. Head: Atraumatic. Nose: No congestion/rhinnorhea. Mouth/Throat: Mucous membranes are moist.  Cardiovascular: Normal rate, regular rhythm. Grossly normal heart sounds.  2+ radial pulses bilaterally. Respiratory: Normal respiratory effort.  No retractions. Lungs CTAB. Gastrointestinal: Soft and nontender. No distention. Musculoskeletal: No lower extremity tenderness nor edema.  Neurologic:  Normal speech and language. No gross focal neurologic deficits are  appreciated.    ED Results / Procedures / Treatments   Labs (all labs ordered are listed, but only abnormal results are displayed) Labs Reviewed  BASIC METABOLIC PANEL - Abnormal; Notable for the following components:      Result Value   Calcium 8.8 (*)    All other components within normal limits  CBC WITH DIFFERENTIAL/PLATELET  TROPONIN I (HIGH SENSITIVITY)  TROPONIN I (HIGH SENSITIVITY)     EKG  ED ECG REPORT I, Chesley Noon, the attending physician, personally viewed and interpreted this ECG.   Date: 09/30/2022  EKG Time: 9:53  Rate: 53  Rhythm: sinus bradycardia  Axis: Normal  Intervals:none  ST&T Change: None  RADIOLOGY Chest x-ray reviewed and interpreted by me with no infiltrate, edema, or effusion.  PROCEDURES:  Critical Care performed: No  Procedures   MEDICATIONS ORDERED IN ED: Medications - No data to display   IMPRESSION / MDM / ASSESSMENT AND PLAN / ED COURSE  I reviewed the triage vital signs and the nursing notes.                              88 y.o. female with past medical history of hypertension,  hyperlipidemia, diabetes, asthma, and Parkinson disease who presents to the ED due to concern for difficulty breathing and wheezing at home.  Patient's presentation is most consistent with acute presentation with potential threat to life or bodily function.  Differential diagnosis includes, but is not limited to, ACS, arrhythmia, PE, pneumonia, pneumothorax, asthma exacerbation, electrolyte abnormality, AKI.  Patient nontoxic-appearing and in no acute distress, vital signs remarkable for borderline bradycardia but otherwise reassuring.  She is not in any respiratory distress and currently denies any difficulty breathing, maintaining oxygen saturations on room air at 96%.  Her lungs are clear to auscultation currently and I do not appreciate any wheezing.  EKG shows sinus bradycardia with no ischemic changes, will observe on cardiac monitor and  screening labs as well as chest x-ray.  Plan to discuss any additional concerns with family and/or caregiver.  Chest x-ray is unremarkable and labs are reassuring.  Troponin within normal limits and no significant anemia, leukocytosis, lecture abnormality, or AKI noted.  Patient's sister and caregiver are now at the bedside, caregiver states earlier that patient had coughed and seemed like she was wheezing with some difficulty breathing.  They now agree that patient's breathing appears back to normal and she continues to have clear breath sounds on reassessment.  It may be that patient's earlier symptoms were due to difficulty clearing some mucus, which she has since been able to do.  She is appropriate for discharge home with PCP follow-up, noted to have elevated blood pressure which will need recheck.  She has also been bradycardic here in the ED, however it appears she has a history of this on prior PCP visits, is not on any AV nodal blocking agents.  Sister and caregiver counseled to have her follow-up with her PCP and to return to the ED for new or worsening symptoms, family agrees with plan.      FINAL CLINICAL IMPRESSION(S) / ED DIAGNOSES   Final diagnoses:  Shortness of breath  Sinus bradycardia  Uncontrolled hypertension     Rx / DC Orders   ED Discharge Orders          Ordered    Nebulizers (COMPRESSOR/NEBULIZER) MISC  As directed        09/30/22 1207    albuterol (PROVENTIL) (2.5 MG/3ML) 0.083% nebulizer solution  Every 4 hours PRN        09/30/22 1207             Note:  This document was prepared using Dragon voice recognition software and may include unintentional dictation errors.   Chesley Noon, MD 09/30/22 1210

## 2022-10-01 ENCOUNTER — Telehealth: Payer: Self-pay

## 2022-10-01 NOTE — Telephone Encounter (Signed)
No longer our pt °

## 2022-12-05 DIAGNOSIS — E538 Deficiency of other specified B group vitamins: Secondary | ICD-10-CM | POA: Diagnosis not present

## 2022-12-05 DIAGNOSIS — E7849 Other hyperlipidemia: Secondary | ICD-10-CM | POA: Diagnosis not present

## 2022-12-05 DIAGNOSIS — E1169 Type 2 diabetes mellitus with other specified complication: Secondary | ICD-10-CM | POA: Diagnosis not present

## 2022-12-17 DIAGNOSIS — I10 Essential (primary) hypertension: Secondary | ICD-10-CM | POA: Diagnosis not present

## 2022-12-17 DIAGNOSIS — G20A1 Parkinson's disease without dyskinesia, without mention of fluctuations: Secondary | ICD-10-CM | POA: Diagnosis not present

## 2022-12-17 DIAGNOSIS — E1142 Type 2 diabetes mellitus with diabetic polyneuropathy: Secondary | ICD-10-CM | POA: Diagnosis not present

## 2022-12-17 DIAGNOSIS — E538 Deficiency of other specified B group vitamins: Secondary | ICD-10-CM | POA: Diagnosis not present

## 2022-12-17 DIAGNOSIS — E785 Hyperlipidemia, unspecified: Secondary | ICD-10-CM | POA: Diagnosis not present

## 2022-12-17 DIAGNOSIS — M5416 Radiculopathy, lumbar region: Secondary | ICD-10-CM | POA: Diagnosis not present

## 2023-01-18 DEATH — deceased
